# Patient Record
Sex: Female | Born: 1950 | Race: White | Hispanic: No | Marital: Married | Smoking: Former smoker
Health system: Southern US, Community
[De-identification: ages and names within clinical notes are randomized; demographics above are authoritative.]

## PROBLEM LIST (undated history)

## (undated) DIAGNOSIS — F32A Depression, unspecified: Secondary | ICD-10-CM

## (undated) DIAGNOSIS — K760 Fatty (change of) liver, not elsewhere classified: Secondary | ICD-10-CM

## (undated) DIAGNOSIS — M810 Age-related osteoporosis without current pathological fracture: Secondary | ICD-10-CM

## (undated) DIAGNOSIS — H269 Unspecified cataract: Secondary | ICD-10-CM

## (undated) DIAGNOSIS — N189 Chronic kidney disease, unspecified: Secondary | ICD-10-CM

## (undated) DIAGNOSIS — I10 Essential (primary) hypertension: Secondary | ICD-10-CM

## (undated) DIAGNOSIS — Z860100 Personal history of colon polyps, unspecified: Secondary | ICD-10-CM

## (undated) DIAGNOSIS — K219 Gastro-esophageal reflux disease without esophagitis: Secondary | ICD-10-CM

## (undated) DIAGNOSIS — M797 Fibromyalgia: Secondary | ICD-10-CM

## (undated) DIAGNOSIS — K589 Irritable bowel syndrome without diarrhea: Secondary | ICD-10-CM

## (undated) DIAGNOSIS — R911 Solitary pulmonary nodule: Secondary | ICD-10-CM

## (undated) DIAGNOSIS — E079 Disorder of thyroid, unspecified: Secondary | ICD-10-CM

## (undated) DIAGNOSIS — E785 Hyperlipidemia, unspecified: Secondary | ICD-10-CM

## (undated) DIAGNOSIS — G4700 Insomnia, unspecified: Secondary | ICD-10-CM

## (undated) DIAGNOSIS — M779 Enthesopathy, unspecified: Secondary | ICD-10-CM

## (undated) DIAGNOSIS — A6000 Herpesviral infection of urogenital system, unspecified: Secondary | ICD-10-CM

## (undated) DIAGNOSIS — F329 Major depressive disorder, single episode, unspecified: Secondary | ICD-10-CM

## (undated) DIAGNOSIS — F419 Anxiety disorder, unspecified: Secondary | ICD-10-CM

## (undated) DIAGNOSIS — I2510 Atherosclerotic heart disease of native coronary artery without angina pectoris: Secondary | ICD-10-CM

## (undated) DIAGNOSIS — F341 Dysthymic disorder: Secondary | ICD-10-CM

## (undated) DIAGNOSIS — G4730 Sleep apnea, unspecified: Secondary | ICD-10-CM

## (undated) DIAGNOSIS — Z5189 Encounter for other specified aftercare: Secondary | ICD-10-CM

## (undated) DIAGNOSIS — K449 Diaphragmatic hernia without obstruction or gangrene: Secondary | ICD-10-CM

## (undated) HISTORY — DX: Major depressive disorder, single episode, unspecified: F32.9

## (undated) HISTORY — DX: Sleep apnea, unspecified: G47.30

## (undated) HISTORY — DX: Fibromyalgia: M79.7

## (undated) HISTORY — DX: Personal history of colonic polyps: Z86.010

## (undated) HISTORY — PX: CORONARY ANGIOPLASTY: SHX604

## (undated) HISTORY — DX: Irritable bowel syndrome, unspecified: K58.9

## (undated) HISTORY — DX: Age-related osteoporosis without current pathological fracture: M81.0

## (undated) HISTORY — PX: CARDIAC CATHETERIZATION: SHX172

## (undated) HISTORY — DX: Personal history of colon polyps, unspecified: Z86.0100

## (undated) HISTORY — DX: Hyperlipidemia, unspecified: E78.5

## (undated) HISTORY — DX: Herpesviral infection of urogenital system, unspecified: A60.00

## (undated) HISTORY — DX: Dysthymic disorder: F34.1

## (undated) HISTORY — PX: CHOLECYSTECTOMY: SHX55

## (undated) HISTORY — DX: Solitary pulmonary nodule: R91.1

## (undated) HISTORY — DX: Enthesopathy, unspecified: M77.9

## (undated) HISTORY — PX: APPENDECTOMY: SHX54

## (undated) HISTORY — DX: Depression, unspecified: F32.A

## (undated) HISTORY — DX: Insomnia, unspecified: G47.00

## (undated) HISTORY — DX: Gastro-esophageal reflux disease without esophagitis: K21.9

## (undated) HISTORY — DX: Disorder of thyroid, unspecified: E07.9

## (undated) HISTORY — DX: Diaphragmatic hernia without obstruction or gangrene: K44.9

## (undated) HISTORY — DX: Atherosclerotic heart disease of native coronary artery without angina pectoris: I25.10

## (undated) HISTORY — PX: TONSILLECTOMY AND ADENOIDECTOMY: SUR1326

## (undated) HISTORY — DX: Fatty (change of) liver, not elsewhere classified: K76.0

## (undated) HISTORY — DX: Essential (primary) hypertension: I10

## (undated) HISTORY — DX: Anxiety disorder, unspecified: F41.9

## (undated) HISTORY — DX: Encounter for other specified aftercare: Z51.89

## (undated) HISTORY — DX: Unspecified cataract: H26.9

## (undated) HISTORY — DX: Chronic kidney disease, unspecified: N18.9

---

## 2000-06-09 ENCOUNTER — Ambulatory Visit (HOSPITAL_BASED_OUTPATIENT_CLINIC_OR_DEPARTMENT_OTHER): Admission: RE | Admit: 2000-06-09 | Discharge: 2000-06-09 | Payer: Self-pay | Admitting: Otolaryngology

## 2008-11-22 DIAGNOSIS — E785 Hyperlipidemia, unspecified: Secondary | ICD-10-CM | POA: Insufficient documentation

## 2008-11-22 DIAGNOSIS — E7801 Familial hypercholesterolemia: Secondary | ICD-10-CM | POA: Insufficient documentation

## 2008-11-24 DIAGNOSIS — K219 Gastro-esophageal reflux disease without esophagitis: Secondary | ICD-10-CM | POA: Insufficient documentation

## 2009-02-13 IMAGING — US US SOFT TISSUE HEAD/NECK
1 series · 14 of 25 positions shown · non-contrast
Comparison: NONE

CLINICAL DATA: Thyroid nodule. 

THYROID ULTRASOUND

[Series 1: us thyroid · 0.07mm/px · 14 of 30 slices shown]
[im 1/30]
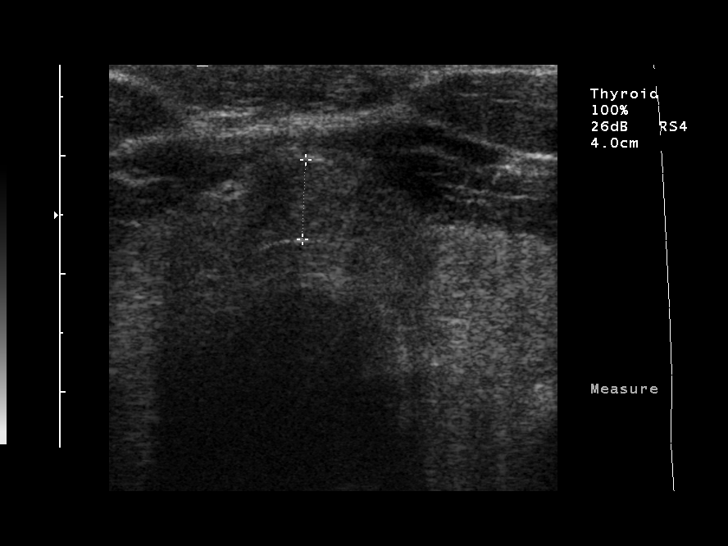
[im 3/30]
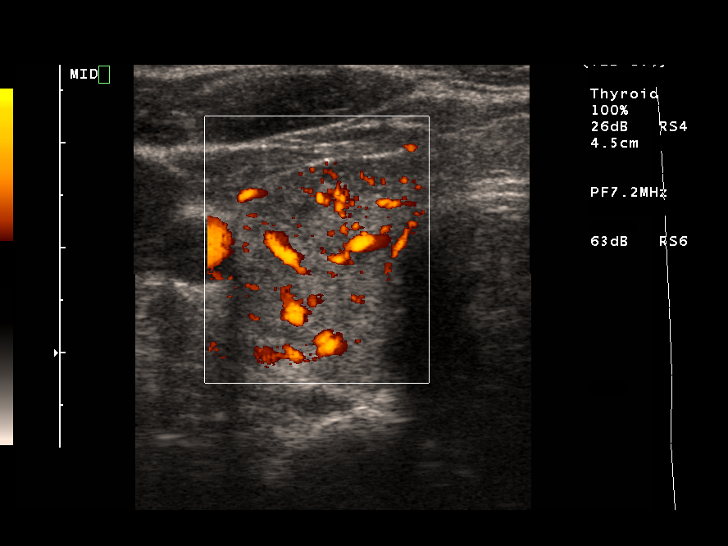
[im 5/30]
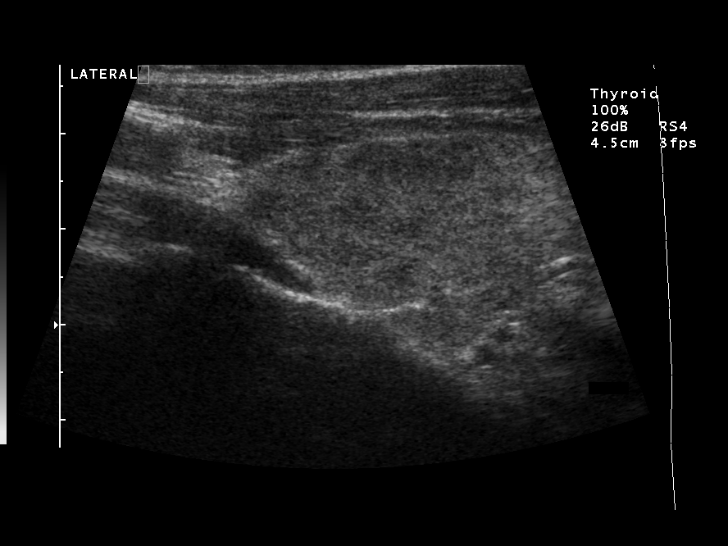
[im 8/30]
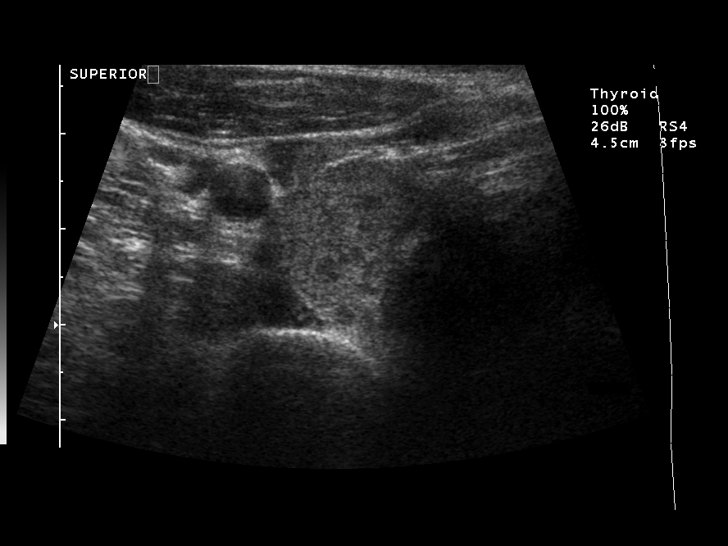
[im 10/30]
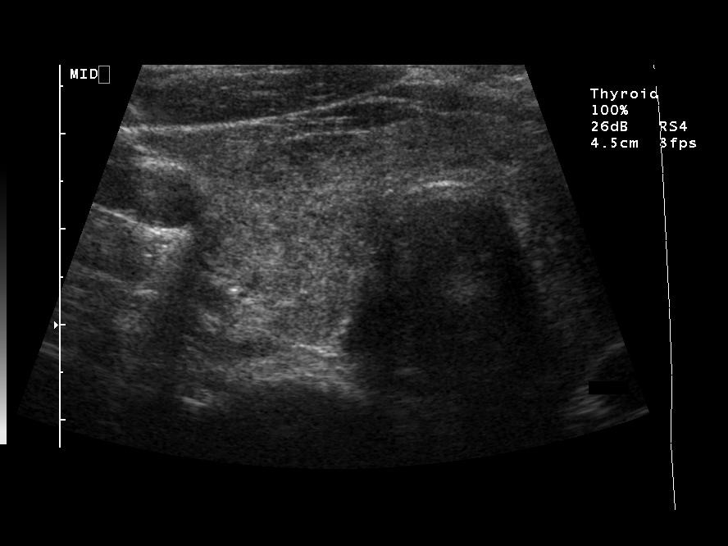
[im 11/30]
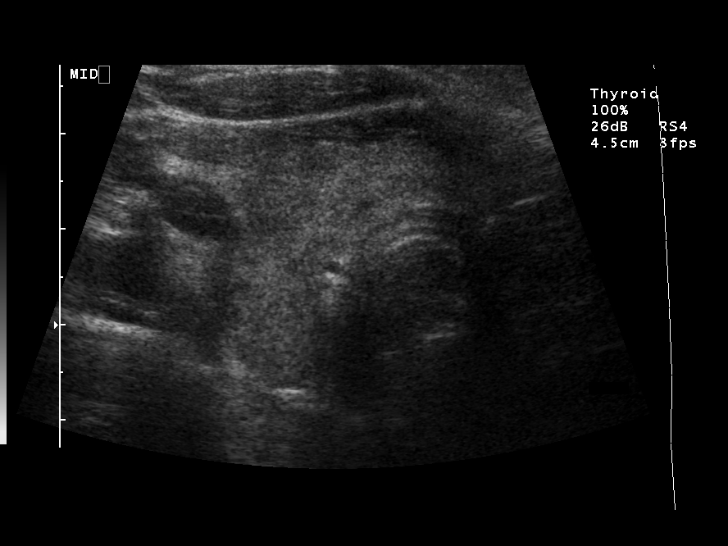
[im 14/30]
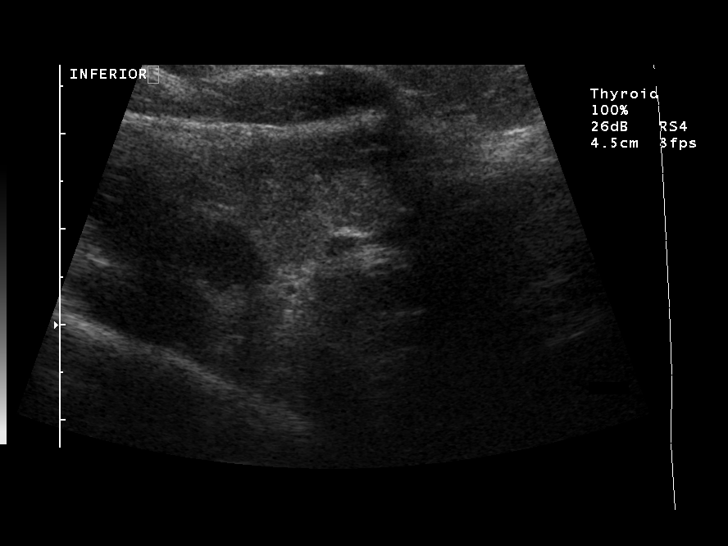
[im 16/30]
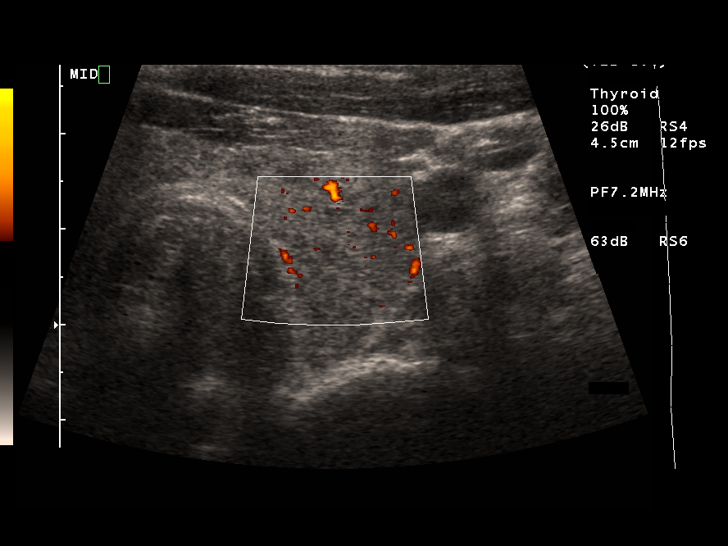
[im 19/30]
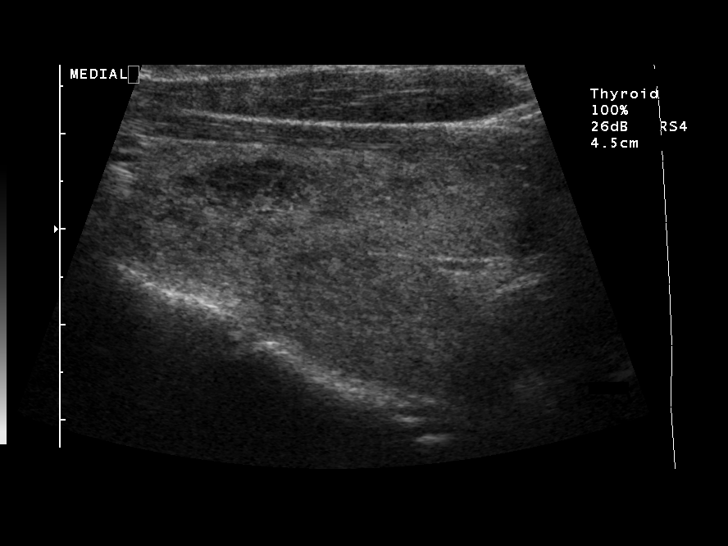
[im 20/30]
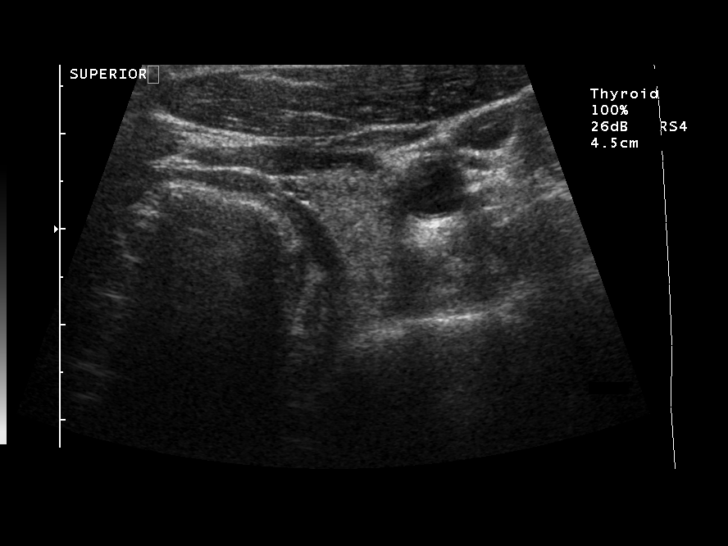
[im 22/30]
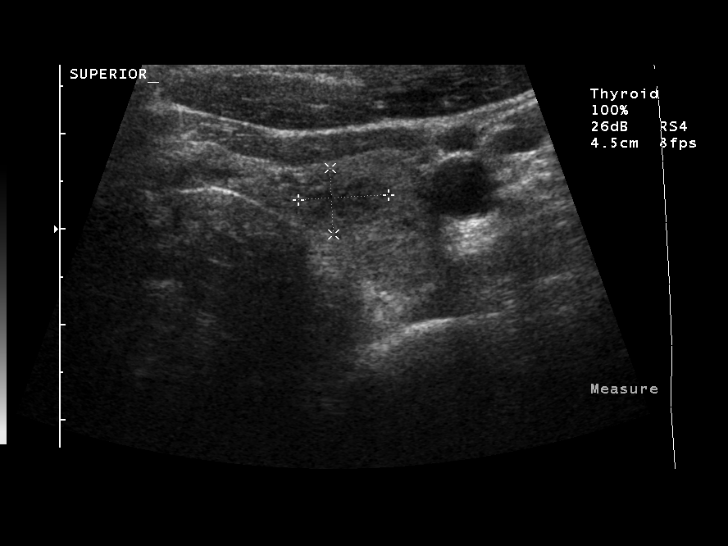
[im 25/30]
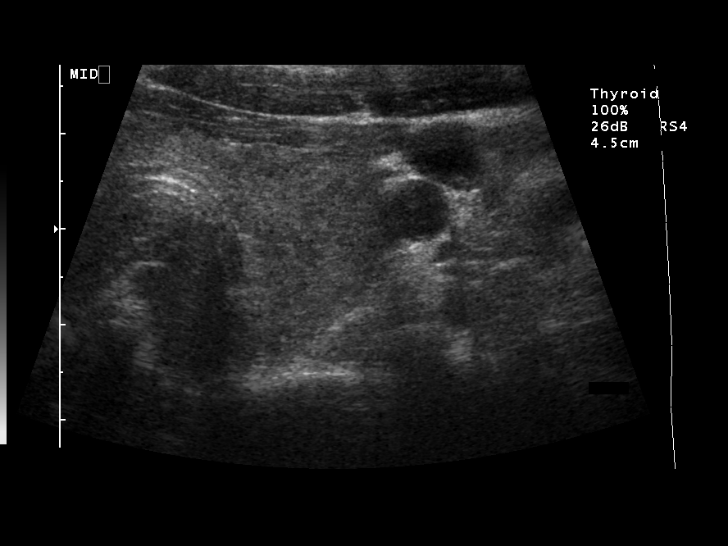
[im 27/30]
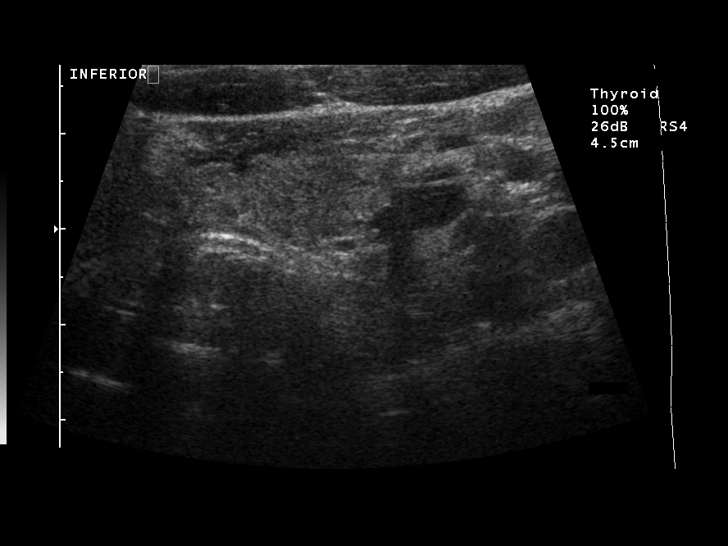
[im 30/30]
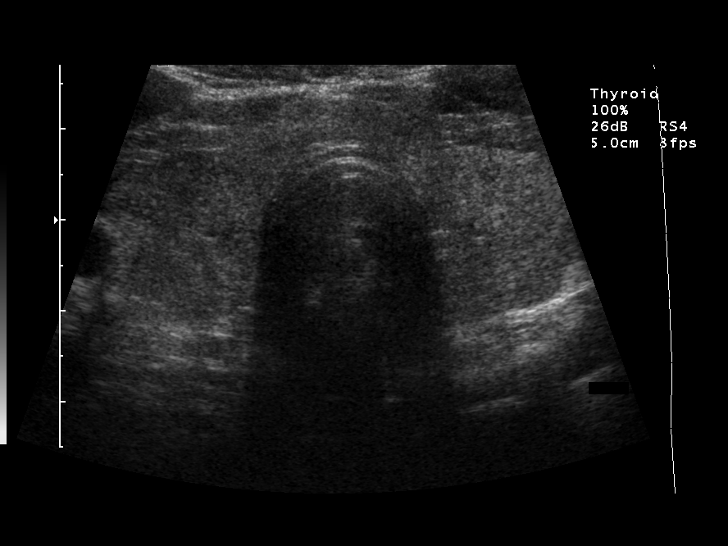

[14 of 25 positions shown; findings below may reference images not displayed]

FINDINGS RIGHT LOBE: 4.6 x 2.2 x 1.6 cm LEFT LOBE: 4.8 x 2.4 x 
1.7 cm ISTHMUS: 0.7 cm The echo pattern is very heterogeneous with 
ill-defined areas of nodular decreased echogenicity noted 
bilaterally. A poorly-defined area of decreased but 
solid-appearing echos is noted in the upper left lobe, measuring 
0.9 x 0.7 x 1.5 cm. I cannot be certain if this represents a 
pseudo nodule or a true nodule. The thyroid gland is mildly 
enlarged.
IMPRESSION: Mild thyromegaly with a heterogeneous echo pattern. 
Possible nodule versus pseudo nodule is noted in the upper left 
lobe, measuring 1.5 cm. I suggest a follow-up study in 3-6 months 
to demonstrate stability. Correlation with nuclear medicine scan 
may also be helpful. Luiyi Foerster, M.D. electronically 
reviewed on 04/05/2007 Dict Date: 04/05/2007  Tran Date: 
04/05/2007 CAV  [REDACTED]

## 2011-07-11 HISTORY — PX: COLONOSCOPY: SHX174

## 2011-07-11 HISTORY — PX: ESOPHAGOGASTRODUODENOSCOPY: SHX1529

## 2011-09-09 DIAGNOSIS — E039 Hypothyroidism, unspecified: Secondary | ICD-10-CM | POA: Insufficient documentation

## 2015-04-24 DIAGNOSIS — I219 Acute myocardial infarction, unspecified: Secondary | ICD-10-CM

## 2015-04-24 HISTORY — DX: Acute myocardial infarction, unspecified: I21.9

## 2015-06-08 DIAGNOSIS — I2510 Atherosclerotic heart disease of native coronary artery without angina pectoris: Secondary | ICD-10-CM | POA: Insufficient documentation

## 2015-06-08 DIAGNOSIS — I10 Essential (primary) hypertension: Secondary | ICD-10-CM | POA: Insufficient documentation

## 2015-06-08 DIAGNOSIS — E785 Hyperlipidemia, unspecified: Secondary | ICD-10-CM | POA: Insufficient documentation

## 2016-08-23 DIAGNOSIS — I10 Essential (primary) hypertension: Secondary | ICD-10-CM | POA: Diagnosis not present

## 2016-08-23 DIAGNOSIS — R079 Chest pain, unspecified: Secondary | ICD-10-CM | POA: Diagnosis not present

## 2016-08-24 DIAGNOSIS — R079 Chest pain, unspecified: Secondary | ICD-10-CM

## 2016-08-24 DIAGNOSIS — I10 Essential (primary) hypertension: Secondary | ICD-10-CM | POA: Diagnosis not present

## 2016-08-24 DIAGNOSIS — I2510 Atherosclerotic heart disease of native coronary artery without angina pectoris: Secondary | ICD-10-CM | POA: Diagnosis not present

## 2016-09-04 ENCOUNTER — Ambulatory Visit: Payer: Self-pay | Admitting: Cardiology

## 2016-09-24 DIAGNOSIS — R079 Chest pain, unspecified: Secondary | ICD-10-CM

## 2016-09-30 DIAGNOSIS — I252 Old myocardial infarction: Secondary | ICD-10-CM | POA: Insufficient documentation

## 2017-04-08 DIAGNOSIS — Z955 Presence of coronary angioplasty implant and graft: Secondary | ICD-10-CM | POA: Insufficient documentation

## 2017-04-16 DIAGNOSIS — N183 Chronic kidney disease, stage 3 unspecified: Secondary | ICD-10-CM | POA: Insufficient documentation

## 2017-04-16 DIAGNOSIS — K589 Irritable bowel syndrome without diarrhea: Secondary | ICD-10-CM | POA: Insufficient documentation

## 2017-09-24 DIAGNOSIS — E063 Autoimmune thyroiditis: Secondary | ICD-10-CM | POA: Insufficient documentation

## 2017-09-24 DIAGNOSIS — M797 Fibromyalgia: Secondary | ICD-10-CM | POA: Insufficient documentation

## 2017-11-23 ENCOUNTER — Ambulatory Visit (INDEPENDENT_AMBULATORY_CARE_PROVIDER_SITE_OTHER): Payer: Medicare Other | Admitting: Podiatry

## 2017-11-23 ENCOUNTER — Ambulatory Visit (INDEPENDENT_AMBULATORY_CARE_PROVIDER_SITE_OTHER): Payer: Medicare Other

## 2017-11-23 ENCOUNTER — Other Ambulatory Visit: Payer: Self-pay

## 2017-11-23 ENCOUNTER — Encounter: Payer: Self-pay | Admitting: Podiatry

## 2017-11-23 VITALS — BP 119/61 | HR 62 | Resp 16 | Ht 66.0 in | Wt 160.0 lb

## 2017-11-23 DIAGNOSIS — M7751 Other enthesopathy of right foot: Secondary | ICD-10-CM | POA: Diagnosis not present

## 2017-11-23 DIAGNOSIS — M79671 Pain in right foot: Secondary | ICD-10-CM

## 2017-11-23 DIAGNOSIS — M7731 Calcaneal spur, right foot: Secondary | ICD-10-CM | POA: Diagnosis not present

## 2017-11-23 DIAGNOSIS — M722 Plantar fascial fibromatosis: Secondary | ICD-10-CM | POA: Diagnosis not present

## 2017-11-23 DIAGNOSIS — M216X1 Other acquired deformities of right foot: Secondary | ICD-10-CM

## 2017-11-23 NOTE — Progress Notes (Signed)
   Subjective:    Patient ID: Suzanne Norris, female    DOB: 06-07-1950, 67 y.o.   MRN: [id number]  HPI    Review of Systems  Musculoskeletal: Positive for arthralgias and myalgias.  All other systems reviewed and are negative.      Objective:   Physical Exam        Assessment & Plan:

## 2017-11-23 NOTE — Patient Instructions (Signed)

## 2017-11-23 NOTE — Progress Notes (Signed)
Subjective:  Patient ID: Suzanne Norris, female    DOB: 12-18-1950,  MRN: [id number]  Chief Complaint  Patient presents with  . Foot Pain    R bottom heel pain x 1 mo; 9/10 stabbing pain -no injuries Pt. stated," it's worst when walking or in the morning." Tx: OTC inserts and tylenol    67 y.o. female presents with the above complaint. Above history confirmed with patient.  Review of Systems: Negative except as noted in the HPI. Denies N/V/F/Ch.  Past Medical History:  Diagnosis Date  . Anxiety and depression   . CAD (coronary artery disease)    post left heart cath  . Hyperlipidemia   . Hypertension   . Thyroid disease     Current Outpatient Medications:  .  aspirin EC 325 MG tablet, Take by mouth., Disp: , Rfl:  .  azithromycin (ZITHROMAX) 250 MG tablet, See admin instructions., Disp: , Rfl: 0 .  buPROPion (WELLBUTRIN) 100 MG tablet, Take 300 mg by mouth daily., Disp: , Rfl:  .  DULoxetine (CYMBALTA) 60 MG capsule, Take 60 mg by mouth daily., Disp: , Rfl: 6 .  levothyroxine (SYNTHROID, LEVOTHROID) 112 MCG tablet, Take 112 mcg by mouth daily., Disp: , Rfl: 5 .  lisinopril (PRINIVIL,ZESTRIL) 2.5 MG tablet, Take 1 tablet by mouth 2 (two) times daily., Disp: , Rfl:  .  metoprolol tartrate (LOPRESSOR) 25 MG tablet, Take 1 tablet by mouth 2 (two) times daily., Disp: , Rfl:  .  Pregnenolone POWD, Take 0.5 mg by mouth daily., Disp: , Rfl:  .  QUEtiapine (SEROQUEL) 25 MG tablet, Take 25 mg by mouth daily., Disp: , Rfl:  .  rosuvastatin (CRESTOR) 20 MG tablet, Take 1 tablet by mouth daily., Disp: , Rfl:  .  thyroid (ARMOUR) 32.5 MG tablet, Take 1.25 tablets by mouth daily. , Disp: , Rfl:   Social History   Tobacco Use  Smoking Status Never Smoker  Smokeless Tobacco Never Used    Allergies  Allergen Reactions  . Atorvastatin Other (See Comments)    Brain fog, muscle pain, and weakness   Objective:   Vitals:   11/23/17 1043  BP: 119/61  Pulse: 62  Resp: 16   Body mass  index is 25.82 kg/m. Constitutional Well developed. Well nourished.  Vascular Dorsalis pedis pulses palpable bilaterally. Posterior tibial pulses palpable bilaterally. Capillary refill normal to all digits.  No cyanosis or clubbing noted. Pedal hair growth normal.  Neurologic Normal speech. Oriented to person, place, and time. Epicritic sensation to light touch grossly present bilaterally.  Dermatologic Nails well groomed and normal in appearance. No open wounds. No skin lesions.  Orthopedic: Normal joint ROM without pain or crepitus bilaterally. No visible deformities. Tender to palpation at the calcaneal tuber right. No pain with calcaneal squeeze right. Ankle ROM diminished range of motion right. Silfverskiold Test: positive right.   Radiographs: Taken and reviewed. No acute fractures or dislocations. No evidence of stress fracture.  Plantar heel spur present. Posterior heel spur absent.   Assessment:   1. Plantar fasciitis   2. Pain of right heel   3. Heel spur, right   4. Bursitis of heel, right   5. Acquired equinus deformity of right foot    Plan:  Patient was evaluated and treated and all questions answered.  Plantar Fasciitis, right - XR reviewed as above.  - Educated on icing and stretching. Instructions given.  - Injection delivered to the plantar fascia as below. - DME: Night splint dispensed. -  Pharmacologic management: Hold off NSAIDs due to CKD Stage III  Procedure: Injection Tendon/Ligament Location: Right plantar fascia at the glabrous junction; medial approach. Skin Prep: alcohol Injectate: 1 cc 0.5% marcaine plain, 1 cc dexamethasone phosphate, 0.5 cc kenalog 10. Disposition: Patient tolerated procedure well. Injection site dressed with a band-aid.  Return in about 3 weeks (around 12/14/2017) for Plantar fasciitis, Right.

## 2017-11-27 ENCOUNTER — Other Ambulatory Visit: Payer: Self-pay | Admitting: Podiatry

## 2017-11-27 DIAGNOSIS — M7751 Other enthesopathy of right foot: Secondary | ICD-10-CM

## 2017-11-27 DIAGNOSIS — M216X1 Other acquired deformities of right foot: Secondary | ICD-10-CM

## 2017-11-27 DIAGNOSIS — M722 Plantar fascial fibromatosis: Secondary | ICD-10-CM

## 2017-11-27 DIAGNOSIS — M79671 Pain in right foot: Secondary | ICD-10-CM

## 2017-11-27 DIAGNOSIS — M7731 Calcaneal spur, right foot: Secondary | ICD-10-CM

## 2017-12-14 ENCOUNTER — Ambulatory Visit (INDEPENDENT_AMBULATORY_CARE_PROVIDER_SITE_OTHER): Payer: Medicare Other | Admitting: Podiatry

## 2017-12-14 DIAGNOSIS — R252 Cramp and spasm: Secondary | ICD-10-CM

## 2017-12-14 DIAGNOSIS — R21 Rash and other nonspecific skin eruption: Secondary | ICD-10-CM | POA: Diagnosis not present

## 2017-12-14 DIAGNOSIS — M79671 Pain in right foot: Secondary | ICD-10-CM

## 2017-12-14 DIAGNOSIS — M722 Plantar fascial fibromatosis: Secondary | ICD-10-CM | POA: Diagnosis not present

## 2017-12-14 NOTE — Progress Notes (Signed)
Subjective:  Patient ID: Suzanne Norris, female    DOB: 25-Nov-1950,  MRN: [id number]  Chief Complaint  Patient presents with  . Plantar Fasciitis    F/U R PF Pt. stated," it's much better, but if Im on my feet a lot it gets sore but not as bas as it used to ." Tx: NS and epsom salt soaking    67 y.o. female presents with the above complaint. Above history confirmed with patient.  New complaints of cramping in the feet and rash to the left foot. States the rash is itchy, has been using oregano oil on it.  Review of Systems: Negative except as noted in the HPI. Denies N/V/F/Ch.  Past Medical History:  Diagnosis Date  . Anxiety and depression   . CAD (coronary artery disease)    post left heart cath  . Hyperlipidemia   . Hypertension   . Thyroid disease     Current Outpatient Medications:  .  aspirin EC 325 MG tablet, Take by mouth., Disp: , Rfl:  .  azithromycin (ZITHROMAX) 250 MG tablet, See admin instructions., Disp: , Rfl: 0 .  buPROPion (WELLBUTRIN) 100 MG tablet, Take 300 mg by mouth daily., Disp: , Rfl:  .  DULoxetine (CYMBALTA) 60 MG capsule, Take 60 mg by mouth daily., Disp: , Rfl: 6 .  levothyroxine (SYNTHROID, LEVOTHROID) 112 MCG tablet, Take 112 mcg by mouth daily., Disp: , Rfl: 5 .  lisinopril (PRINIVIL,ZESTRIL) 2.5 MG tablet, Take 1 tablet by mouth 2 (two) times daily., Disp: , Rfl:  .  metoprolol tartrate (LOPRESSOR) 25 MG tablet, Take 1 tablet by mouth 2 (two) times daily., Disp: , Rfl:  .  Pregnenolone POWD, Take 0.5 mg by mouth daily., Disp: , Rfl:  .  QUEtiapine (SEROQUEL) 25 MG tablet, Take 25 mg by mouth daily., Disp: , Rfl:  .  rosuvastatin (CRESTOR) 20 MG tablet, Take 1 tablet by mouth daily., Disp: , Rfl:  .  thyroid (ARMOUR) 32.5 MG tablet, Take 1.25 tablets by mouth daily. , Disp: , Rfl:   Social History   Tobacco Use  Smoking Status Never Smoker  Smokeless Tobacco Never Used    Allergies  Allergen Reactions  . Atorvastatin Other (See Comments)      Brain fog, muscle pain, and weakness   Objective:   There were no vitals filed for this visit. There is no height or weight on file to calculate BMI. Constitutional Well developed. Well nourished.  Vascular Dorsalis pedis pulses palpable bilaterally. Posterior tibial pulses palpable bilaterally. Capillary refill normal to all digits.  No cyanosis or clubbing noted. Pedal hair growth normal.  Neurologic Normal speech. Oriented to person, place, and time. Epicritic sensation to light touch grossly present bilaterally.  Dermatologic Nails well groomed and normal in appearance. No open wounds. No skin lesions. Papular rash left plantar arch.  Orthopedic: Normal joint ROM without pain or crepitus bilaterally. No visible deformities. Tender to palpation at the calcaneal tuber right. No pain with calcaneal squeeze right. Ankle ROM diminished range of motion right. Silfverskiold Test: positive right.   Radiographs:  10/14: No acute fractures or dislocations. No evidence of stress fracture.  Plantar heel spur present. Posterior heel spur absent.   Assessment:   1. Plantar fasciitis   2. Pain of right heel   3. Cramp in limb   4. Rash    Plan:  Patient was evaluated and treated and all questions answered.  Plantar Fasciitis, right - Injection #2 delivered to the plantar  fascia as below.  Cramping in Feet - Recommend tonic water / quinine for cramping  Eczema L Foot - Recc Hydrocortisone cream  Return in about 3 weeks (around 01/04/2018) for Plantar fasciitis, Right; rash left.

## 2018-01-04 ENCOUNTER — Ambulatory Visit (INDEPENDENT_AMBULATORY_CARE_PROVIDER_SITE_OTHER): Payer: Medicare Other | Admitting: Podiatry

## 2018-01-04 DIAGNOSIS — R252 Cramp and spasm: Secondary | ICD-10-CM

## 2018-01-04 DIAGNOSIS — M79671 Pain in right foot: Secondary | ICD-10-CM

## 2018-01-04 DIAGNOSIS — M722 Plantar fascial fibromatosis: Secondary | ICD-10-CM

## 2018-01-04 DIAGNOSIS — B351 Tinea unguium: Secondary | ICD-10-CM | POA: Diagnosis not present

## 2018-01-04 DIAGNOSIS — R21 Rash and other nonspecific skin eruption: Secondary | ICD-10-CM

## 2018-01-04 MED ORDER — CICLOPIROX 8 % EX SOLN: Freq: Every day | CUTANEOUS | 0 refills | Status: DC

## 2018-01-04 NOTE — Progress Notes (Signed)
Subjective:  Patient ID: Suzanne Norris, female    DOB: May 25, 1950,  MRN: [id number]  No chief complaint on file.   67 y.o. female presents with the above complaint. States that the heels are doing much better. Stopped wearing the boot to do some irritation is experiencing from the outside of it after slipping around.  Trying to reduce and thinks that is helping with the cramping.  Uses the anti-inflammatory cream for the rash on her arch and states is doing much better.  New complaint of fungal toenails has tried several over-the-counter but he is interested in other options.  Review of Systems: Negative except as noted in the HPI. Denies N/V/F/Ch.  Past Medical History:  Diagnosis Date  . Anxiety and depression   . CAD (coronary artery disease)    post left heart cath  . Hyperlipidemia   . Hypertension   . Thyroid disease     Current Outpatient Medications:  .  aspirin EC 325 MG tablet, Take by mouth., Disp: , Rfl:  .  azithromycin (ZITHROMAX) 250 MG tablet, See admin instructions., Disp: , Rfl: 0 .  buPROPion (WELLBUTRIN) 100 MG tablet, Take 300 mg by mouth daily., Disp: , Rfl:  .  ciclopirox (PENLAC) 8 % solution, Apply topically at bedtime. Apply over nail and surrounding skin. Apply daily over previous coat. Remove weekly with file or polish remover., Disp: 6.6 mL, Rfl: 0 .  DULoxetine (CYMBALTA) 60 MG capsule, Take 60 mg by mouth daily., Disp: , Rfl: 6 .  levothyroxine (SYNTHROID, LEVOTHROID) 112 MCG tablet, Take 112 mcg by mouth daily., Disp: , Rfl: 5 .  lisinopril (PRINIVIL,ZESTRIL) 2.5 MG tablet, Take 1 tablet by mouth 2 (two) times daily., Disp: , Rfl:  .  metoprolol tartrate (LOPRESSOR) 25 MG tablet, Take 1 tablet by mouth 2 (two) times daily., Disp: , Rfl:  .  Pregnenolone POWD, Take 0.5 mg by mouth daily., Disp: , Rfl:  .  QUEtiapine (SEROQUEL) 25 MG tablet, Take 25 mg by mouth daily., Disp: , Rfl:  .  rosuvastatin (CRESTOR) 20 MG tablet, Take 1 tablet by mouth daily.,  Disp: , Rfl:  .  thyroid (ARMOUR) 32.5 MG tablet, Take 1.25 tablets by mouth daily. , Disp: , Rfl:   Social History   Tobacco Use  Smoking Status Never Smoker  Smokeless Tobacco Never Used    Allergies  Allergen Reactions  . Atorvastatin Other (See Comments)    Brain fog, muscle pain, and weakness   Objective:   There were no vitals filed for this visit. There is no height or weight on file to calculate BMI. Constitutional Well developed. Well nourished.  Vascular Dorsalis pedis pulses palpable bilaterally. Posterior tibial pulses palpable bilaterally. Capillary refill normal to all digits.  No cyanosis or clubbing noted. Pedal hair growth normal.  Neurologic Normal speech. Oriented to person, place, and time. Epicritic sensation to light touch grossly present bilaterally.  Dermatologic Nails thickend, dystrophic, yellow in appearance. No open wounds. No skin lesions. Resolving rash plantar arch left.  Orthopedic: Normal joint ROM without pain or crepitus bilaterally. No visible deformities. No tenderness medial calc bilat No pain with calcaneal squeeze right. Ankle ROM diminished range of motion right. Silfverskiold Test: positive right.   Radiographs:  10/14: No acute fractures or dislocations. No evidence of stress fracture.  Plantar heel spur present. Posterior heel spur absent.   Assessment:   1. Onychomycosis   2. Plantar fasciitis   3. Pain of right heel   4. Cramp  in limb   5. Rash    Plan:  Patient was evaluated and treated and all questions answered.  Plantar Fasciitis, right - Defer injection today, pain improved. - Continue stretching and icing  Cramping in Feet - Improving somewhat  Eczema L Foot - Resolved.  Onychomycosis -Rx Penlac. Educated on use -Avoid oral meds due to CKD.  No follow-ups on file.

## 2018-03-09 ENCOUNTER — Ambulatory Visit (INDEPENDENT_AMBULATORY_CARE_PROVIDER_SITE_OTHER): Payer: Medicare Other | Admitting: Podiatry

## 2018-03-09 ENCOUNTER — Other Ambulatory Visit: Payer: Self-pay

## 2018-03-09 DIAGNOSIS — M722 Plantar fascial fibromatosis: Secondary | ICD-10-CM | POA: Diagnosis not present

## 2018-03-09 DIAGNOSIS — B351 Tinea unguium: Secondary | ICD-10-CM

## 2018-03-09 DIAGNOSIS — M79671 Pain in right foot: Secondary | ICD-10-CM | POA: Diagnosis not present

## 2018-03-09 MED ORDER — CICLOPIROX 8 % EX SOLN: Freq: Every day | CUTANEOUS | 0 refills | Status: DC

## 2018-03-09 NOTE — Progress Notes (Signed)
Subjective:  Patient ID: Suzanne Norris, female    DOB: Jun 16, 1950,  MRN: [id number]  Chief Complaint  Patient presents with  . Plantar Fasciitis    F/U R PF Pt. states," pretty good, but if I'm on it a lot it hurts the next day; 3/10 intermittnet pain." Tx: stretching, icing and fascial brace  . Nail Problem    F/U nail fungus PT. states," somewhat better." Tx: penlac    68 y.o. female presents with the above complaint.    Review of Systems: Negative except as noted in the HPI. Denies N/V/F/Ch.  Past Medical History:  Diagnosis Date  . Anxiety and depression   . CAD (coronary artery disease)    post left heart cath  . Hyperlipidemia   . Hypertension   . Thyroid disease     Current Outpatient Medications:  .  aspirin EC 325 MG tablet, Take by mouth., Disp: , Rfl:  .  azithromycin (ZITHROMAX) 250 MG tablet, See admin instructions., Disp: , Rfl: 0 .  buPROPion (WELLBUTRIN XL) 150 MG 24 hr tablet, , Disp: , Rfl:  .  buPROPion (WELLBUTRIN) 100 MG tablet, Take 300 mg by mouth daily., Disp: , Rfl:  .  ciclopirox (PENLAC) 8 % solution, Apply topically at bedtime. Apply over nail and surrounding skin. Apply daily over previous coat. Remove weekly with file or polish remover., Disp: 6.6 mL, Rfl: 0 .  DULoxetine (CYMBALTA) 60 MG capsule, Take 60 mg by mouth daily., Disp: , Rfl: 6 .  levothyroxine (SYNTHROID, LEVOTHROID) 112 MCG tablet, Take 112 mcg by mouth daily., Disp: , Rfl: 5 .  lisinopril (PRINIVIL,ZESTRIL) 2.5 MG tablet, Take 1 tablet by mouth 2 (two) times daily., Disp: , Rfl:  .  metoprolol tartrate (LOPRESSOR) 25 MG tablet, Take 1 tablet by mouth 2 (two) times daily., Disp: , Rfl:  .  Pregnenolone POWD, Take 0.5 mg by mouth daily., Disp: , Rfl:  .  QUEtiapine (SEROQUEL) 25 MG tablet, Take 25 mg by mouth daily., Disp: , Rfl:  .  rosuvastatin (CRESTOR) 20 MG tablet, Take 1 tablet by mouth daily., Disp: , Rfl:  .  thyroid (ARMOUR) 32.5 MG tablet, Take 1.25 tablets by mouth daily. ,  Disp: , Rfl:   Social History   Tobacco Use  Smoking Status Never Smoker  Smokeless Tobacco Never Used    Allergies  Allergen Reactions  . Atorvastatin Other (See Comments)    Brain fog, muscle pain, and weakness   Objective:   There were no vitals filed for this visit. There is no height or weight on file to calculate BMI. Constitutional Well developed. Well nourished.  Vascular Dorsalis pedis pulses palpable bilaterally. Posterior tibial pulses palpable bilaterally. Capillary refill normal to all digits.  No cyanosis or clubbing noted. Pedal hair growth normal.  Neurologic Normal speech. Oriented to person, place, and time. Epicritic sensation to light touch grossly present bilaterally.  Dermatologic Nails thickend, dystrophic, yellow in appearance. Clear growth noted. No open wounds. No skin lesions. Resolving rash plantar arch left.  Orthopedic: Normal joint ROM without pain or crepitus bilaterally. No visible deformities. No tenderness medial calc bilat No pain with calcaneal squeeze right. Ankle ROM diminished range of motion right. Silfverskiold Test: positive right.   Radiographs:  10/14: No acute fractures or dislocations. No evidence of stress fracture.  Plantar heel spur present. Posterior heel spur absent.   Assessment:   1. Plantar fasciitis   2. Pain of right heel   3. Onychomycosis  Plan:  Patient was evaluated and treated and all questions answered.  Plantar Fasciitis, right - Continue stretching and icing - Continue PF brace  Onychomycosis -Refill Penlac  Return if symptoms worsen or fail to improve.

## 2019-06-22 DIAGNOSIS — Z955 Presence of coronary angioplasty implant and graft: Secondary | ICD-10-CM

## 2019-06-22 HISTORY — PX: CORONARY ANGIOPLASTY WITH STENT PLACEMENT: SHX49

## 2019-06-22 HISTORY — DX: Presence of coronary angioplasty implant and graft: Z95.5

## 2019-06-23 ENCOUNTER — Encounter: Payer: Self-pay | Admitting: Gastroenterology

## 2019-06-24 ENCOUNTER — Ambulatory Visit: Payer: BLUE CROSS/BLUE SHIELD | Admitting: Gastroenterology

## 2019-08-03 ENCOUNTER — Ambulatory Visit (INDEPENDENT_AMBULATORY_CARE_PROVIDER_SITE_OTHER): Payer: Medicare Other | Admitting: Gastroenterology

## 2019-08-03 ENCOUNTER — Encounter: Payer: Self-pay | Admitting: Gastroenterology

## 2019-08-03 VITALS — BP 114/64 | HR 50 | Temp 97.3°F | Ht 65.0 in | Wt 169.2 lb

## 2019-08-03 DIAGNOSIS — I25119 Atherosclerotic heart disease of native coronary artery with unspecified angina pectoris: Secondary | ICD-10-CM

## 2019-08-03 DIAGNOSIS — K219 Gastro-esophageal reflux disease without esophagitis: Secondary | ICD-10-CM | POA: Diagnosis not present

## 2019-08-03 DIAGNOSIS — K760 Fatty (change of) liver, not elsewhere classified: Secondary | ICD-10-CM | POA: Diagnosis not present

## 2019-08-03 DIAGNOSIS — K449 Diaphragmatic hernia without obstruction or gangrene: Secondary | ICD-10-CM

## 2019-08-03 DIAGNOSIS — K581 Irritable bowel syndrome with constipation: Secondary | ICD-10-CM

## 2019-08-03 MED ORDER — PANTOPRAZOLE SODIUM 40 MG PO TBEC
40.0000 mg | DELAYED_RELEASE_TABLET | Freq: Every day | ORAL | 11 refills | Status: DC
Start: 2019-08-03 — End: 2020-07-25

## 2019-08-03 NOTE — Progress Notes (Signed)
Chief Complaint: GERD/constipation/abnormal CT chest.  Referring Provider:  Imagene Riches, NP      ASSESSMENT AND PLAN;   #1. GERD with small HH. H/O short segment Barrett's  #2. Fatty liver with Nl LFTs  #3. CAD s/p recent RCA stenting May 2021, currently on aspirin/Plavix.  #4. IBS-C   Plan: -Restart Protonix 40mg  po qd #30, 11 refills -Benefiber 1 TBS po QD with 8 ounces of water or can put it in coffee. -Walking (she is scheduled to go through cardiac rehab) -Lose 6lb over 12 weeks.  Avoid fatty foods, sodas and high calorie corn syrup. -CMP, CBC @ Dr Sudie Grumbling office.  Patient is scheduled to have blood work done there already. -EGD/colon with 2-day prep when OK with Dr Otho Perl (likely May 2022-that will be 1 year after recent RCA stenting when she can be taken off Plavix) -FU in 6 months.  Earlier, if still with problems.   HPI:    Suzanne Norris is a 69 y.o. female  With CAD s/p RCA stent 5/12/2021on asa/plavix, NSTEMI s/p LAD stent 04/2015, HTN, HLD, fatty liver on CT chest, CKD3, hypothyroidism, anxiety/depression/fibromyalgia.  Here for further evaluation of reflux and fatty liver on CT.  She would occasionally have heartburn without odynophagia or dysphagia.  Has been diagnosed as having small hiatal hernia previously on EGD and most recently on CT.  She was also having nonspecific postprandial symptoms.  Underwent cardiac evaluation which showed significant RCA stenosis and is s/p RCA stenting on Jun 22, 2019.  She is currently on aspirin/Plavix.  Her upper GI symptoms have gotten better except for heartburn.  She does have previously diagnosed IBS-C with pellet-like stools, lower abdominal discomfort with bloating which gets better with defecation.  Her constipation is better with fiber intake.  She underwent CTA chest which showed fatty infiltration of the liver.  Her liver function tests were unremarkable.  It also showed small hiatal hernia.  She does admit  that she has gained weight.  9 pounds since 2019.  As detailed below Wt Readings from Last 3 Encounters:  08/03/19 169 lb 4 oz (76.8 kg)  11/23/17 160 lb (72.6 kg)   Past GI procedures: -EGD 07/11/2011: Short segment Barrett's esophagus (Bx-consistent with Barrett's without dysplasia), small hiatal hernia.  Got letters to repeat EGD in 3 years. -Colonoscopy 06/2011 fair preparation, highly redundant colon.  Negative except for internal hemorrhoids.  Recommended to repeat in 5 years.  Letters were sent but no response.  CTA chest 06/21/2019: -No acute abnormality. -Pulmonary nodules.  Being followed by Dr. Otho Perl -Small hiatal hernia -Fatty liver without cirrhosis. -S/p cholecystectomy.    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 Anxiety and depression   . CAD (coronary artery disease)    post left heart cath  . Depression   . Dysthymia   . Fibromyalgia   . Hiatal hernia with GERD   . History of colonic polyps   . Hyperlipidemia   . Hypertension   . IBS (irritable bowel syndrome)   . Kidney disease    stage 3  . Thyroid disease     Past Surgical History:  Procedure Laterality Date  . APPENDECTOMY    . CARDIAC CATHETERIZATION    . CHOLECYSTECTOMY    . COLONOSCOPY  07/11/2011   Small internal hemorrhoids. Highly redundant colon. Otherwise grossly normal colonoscopy to the terminal ileum   . CORONARY ANGIOPLASTY    . CORONARY ANGIOPLASTY WITH STENT PLACEMENT  06/22/2019   [facility]  . ESOPHAGOGASTRODUODENOSCOPY  07/11/2011   Small hiatal hernia. Irregular Z-line suggestive of gastroesophageal reflux with a tongue-like salmon-colored mucosa projecting above the gastroesophageal junction (biposied to rule out short segment Barrett's esophagus).   . TONSILLECTOMY AND ADENOIDECTOMY      Family History  Problem Relation Age of Onset  . Heart attack Father   . Alcoholism Father   . Heart disease Father   . Colon cancer Maternal Grandmother   .  Stomach cancer Maternal Grandmother   . Esophageal cancer Neg Hx     Social History   Tobacco Use  . Smoking status: Former Research scientist (life sciences)  . Smokeless tobacco: Never Used  Vaping Use  . Vaping Use: Never used  Substance Use Topics  . Alcohol use: Not Currently  . Drug use: No    Current Outpatient Medications  Medication Sig Dispense Refill  . amLODipine (NORVASC) 2.5 MG tablet Take 2.5 mg by mouth daily.    Marland Kitchen aspirin EC 81 MG tablet Take 81 mg by mouth daily. Swallow whole.    Marland Kitchen buPROPion (WELLBUTRIN XL) 300 MG 24 hr tablet Take 300 mg by mouth daily.    . clopidogrel (PLAVIX) 75 MG tablet Take 75 mg by mouth daily.    . DULoxetine (CYMBALTA) 60 MG capsule Take 60 mg by mouth daily.  6  . ezetimibe (ZETIA) 10 MG tablet Take 10 mg by mouth daily.    Marland Kitchen levothyroxine (SYNTHROID) 88 MCG tablet Take 88 mcg by mouth daily before breakfast.    . lisinopril (PRINIVIL,ZESTRIL) 2.5 MG tablet Take 1 tablet by mouth 2 (two) times daily.    Marland Kitchen LORazepam (ATIVAN) 0.5 MG tablet Take 0.5 mg by mouth as needed for anxiety.    . methylphenidate (RITALIN) 5 MG tablet Take 5 mg by mouth 2 (two) times daily.    . metoprolol tartrate (LOPRESSOR) 50 MG tablet Take 50 mg by mouth 2 (two) times daily.    . nitroGLYCERIN (NITROSTAT) 0.4 MG SL tablet Place 0.4 mg under the tongue every 5 (five) minutes as needed for chest pain.    . rosuvastatin (CRESTOR) 40 MG tablet Take 40 mg by mouth daily.    Marland Kitchen zolpidem (AMBIEN CR) 12.5 MG CR tablet Take 12.5 mg by mouth at bedtime as needed for sleep.     No current facility-administered medications for this visit.    Allergies  Allergen Reactions  . Atorvastatin Other (See Comments)    Brain fog, muscle pain, and weakness  . Egg [Eggs Or Egg-Derived Products]     Sensitivity. Paraoral demitits    Review of Systems:  Constitutional: Denies fever, chills, diaphoresis, appetite change and has fatigue.  HEENT: Denies photophobia, eye pain, redness, hearing loss, ear  pain, congestion, sore throat, rhinorrhea, sneezing, mouth sores, neck pain, neck stiffness and tinnitus.   Respiratory: Denies SOB, DOE, cough, chest tightness,  and wheezing.   Cardiovascular: Denies chest pain, palpitations and leg swelling.  Genitourinary: Denies dysuria, urgency, frequency, hematuria, flank pain and difficulty urinating.  Musculoskeletal: has myalgias, back pain, No joint swelling, arthralgias and gait problem.  Skin: No rash.  Neurological: Denies dizziness, seizures, syncope, weakness, light-headedness, numbness and headaches.  Hematological: Denies adenopathy. Easy bruising, personal or family bleeding history  Psychiatric/Behavioral: Has anxiety or depression     Physical Exam:    BP 114/64   Pulse (!) 50   Temp (!) 97.3 F (36.3 C)   Ht 5\' 5"  (1.651 m)   Wt  169 lb 4 oz (76.8 kg)   BMI 28.16 kg/m  Wt Readings from Last 3 Encounters:  08/03/19 169 lb 4 oz (76.8 kg)  11/23/17 160 lb (72.6 kg)   Constitutional:  Well-developed, in no acute distress. Psychiatric: Normal mood and affect. Behavior is normal. HEENT: Pupils normal.  Conjunctivae are normal. No scleral icterus. Neck supple.  Cardiovascular: Normal rate, regular rhythm. No edema Pulmonary/chest: Effort normal and breath sounds normal. No wheezing, rales or rhonchi. Abdominal: Soft, nondistended. Nontender. Bowel sounds active throughout. There are no masses palpable. No hepatomegaly. Rectal:  defered Neurological: Alert and oriented to person place and time. Skin: Skin is warm and dry. No rashes noted.  Data Reviewed: I have personally reviewed following labs and imaging studies     Carmell Austria, MD 08/03/2019, 11:18 AM  Cc: Imagene Riches, NP

## 2019-08-03 NOTE — Patient Instructions (Addendum)
If you are age 67 or older, your body mass index should be between 23-30. Your Body mass index is 28.16 kg/m. If this is out of the aforementioned range listed, please consider follow up with your Primary Care Provider.  If you are age 67 or younger, your body mass index should be between 19-25. Your Body mass index is 28.16 kg/m. If this is out of the aformentioned range listed, please consider follow up with your Primary Care Provider.   We have sent the following medications to your pharmacy for you to pick up at your convenience: Protonix 40 mg daily  Benefiber 1 tablespoon daily. (this is over-the-counter)  Walking (cardiac rehab)  Lose 6 pounds over 12 weeks.  EGD/Colon when cleared with Dr. Otho Perl.  Thank you for choosing me and [facility].   Jackquline Denmark, MD

## 2020-07-25 ENCOUNTER — Other Ambulatory Visit: Payer: Self-pay | Admitting: Gastroenterology

## 2020-09-18 ENCOUNTER — Ambulatory Visit: Payer: PRIVATE HEALTH INSURANCE | Admitting: Gastroenterology

## 2020-11-01 ENCOUNTER — Other Ambulatory Visit: Payer: Self-pay | Admitting: Gastroenterology

## 2020-11-09 ENCOUNTER — Encounter: Payer: Self-pay | Admitting: Gastroenterology

## 2020-11-09 ENCOUNTER — Other Ambulatory Visit: Payer: Self-pay

## 2020-11-09 ENCOUNTER — Ambulatory Visit (INDEPENDENT_AMBULATORY_CARE_PROVIDER_SITE_OTHER): Payer: Medicare Other | Admitting: Gastroenterology

## 2020-11-09 VITALS — BP 130/62 | HR 99 | Ht 65.0 in | Wt 143.0 lb

## 2020-11-09 DIAGNOSIS — K219 Gastro-esophageal reflux disease without esophagitis: Secondary | ICD-10-CM

## 2020-11-09 DIAGNOSIS — Z1211 Encounter for screening for malignant neoplasm of colon: Secondary | ICD-10-CM

## 2020-11-09 DIAGNOSIS — K581 Irritable bowel syndrome with constipation: Secondary | ICD-10-CM | POA: Diagnosis not present

## 2020-11-09 DIAGNOSIS — K449 Diaphragmatic hernia without obstruction or gangrene: Secondary | ICD-10-CM

## 2020-11-09 DIAGNOSIS — K760 Fatty (change of) liver, not elsewhere classified: Secondary | ICD-10-CM

## 2020-11-09 NOTE — Progress Notes (Signed)
Chief Complaint: FU  Referring Provider:  Imagene Riches, NP      ASSESSMENT AND PLAN;   #1. GERD with small HH. H/O short segment Barrett's  #2. Fatty liver with Nl LFTs  #3. CAD s/p recent RCA stenting May 2021  #4. IBS-C  #5. Colorectal cancer screening   Plan: -Continue Protonix. Car try it every other day -Benefiber 1 TBS po QD with 8 ounces of water or can put it in coffee. -EGD/colon with Miralax prep   I discussed EGD/Colonoscopy- the indications, risks, alternatives and potential complications including, but not limited to, bleeding, infection, reaction to medication, damage to internal organs, cardiac and/or pulmonary problems, and perforation requiring surgery. The possibility that significant findings could be missed was explained. All ? were answered. The patient gives consent to proceed.  HPI:    Suzanne KREISER is a 70 y.o. female  With CAD s/p RCA stent 06/22/2019, NSTEMI s/p LAD stent 04/2015 (Nl EF), HTN, HLD, fatty liver on CT chest, CKD3, hypothyroidism, anxiety/depression/fibromyalgia.  For FU visit.  Doing much better from GI standpoint.  Reflux under good control with Protonix 40 mg p.o. once a day.  Constipation is much better with Benefiber.  She has also stopped her amlodipine d/t dizziness.  Has reduced weight from 160lb to 143lb as below.  Here for further evaluation of reflux and fatty liver on CT. Wt Readings from Last 3 Encounters:  11/09/20 143 lb (64.9 kg)  08/03/19 169 lb 4 oz (76.8 kg)  11/23/17 160 lb (72.6 kg)   She is off plavix and continued on aspirin.   Past GI procedures: -EGD 07/11/2011: Short segment Barrett's esophagus (Bx-consistent with Barrett's without dysplasia), small hiatal hernia.  Got letters to repeat EGD in 3 years. -Colonoscopy 06/2011 fair preparation, highly redundant colon.  Negative except for internal hemorrhoids.  Recommended to repeat in 5 years.  Letters were sent but no response.  CTA chest  06/21/2019: -No acute abnormality. -Pulmonary nodules.  Being followed by Dr. Otho Perl -Small hiatal hernia -Fatty liver without cirrhosis. -S/p cholecystectomy.    SH: -Retd Education officer, [employer] Past Medical History:  Diagnosis Date   Anxiety and depression    have GAD and recurrent major depression (Dr Pasty Arch, MD [place])   CAD (coronary artery disease)    post left heart cath.      Dr Abran Richard, MD [facility]   Chronic kidney disease    stage 3 Dr. Belinda Block MD, [facility]    Dysthymia    Fatty liver    Fibromyalgia    Genital herpes    H/O heart artery stent 06/22/2019   Ostial RCA   Heart attack ([place]) 04/24/2015   NSTEMI, LAD   Hiatal hernia with GERD    History of colonic polyps    Hyperlipidemia    Hypertension    IBS (irritable bowel syndrome)    Insomnia    Lesion of right lung    (granulomatous), multiple lesions, glass lung opacity   Tendonitis    Thyroid disease    Hypothyroidism, Hasimoto's throiditis Dr Francetta Found, [facility]    Past Surgical History:  Procedure Laterality Date   APPENDECTOMY     CARDIAC CATHETERIZATION     CHOLECYSTECTOMY     COLONOSCOPY  07/11/2011   Small internal hemorrhoids. Highly redundant colon. Otherwise grossly normal colonoscopy to the terminal ileum    CORONARY ANGIOPLASTY     CORONARY ANGIOPLASTY WITH STENT PLACEMENT  06/22/2019  [place]   ESOPHAGOGASTRODUODENOSCOPY  07/11/2011   Small hiatal hernia. Irregular Z-line suggestive of gastroesophageal reflux with a tongue-like salmon-colored mucosa projecting above the gastroesophageal junction (biposied to rule out short segment Barrett's esophagus).    TONSILLECTOMY AND ADENOIDECTOMY      Family History  Problem Relation Age of Onset   Heart attack Father    Alcoholism Father    Heart disease Father    Colon cancer Maternal Grandmother    Stomach cancer Maternal Grandmother    Esophageal cancer Neg Hx     Social History   Tobacco Use   Smoking  status: Former   Smokeless tobacco: Never  Scientific laboratory technician Use: Never used  Substance Use Topics   Alcohol use: Not Currently   Drug use: No    Current Outpatient Medications  Medication Sig Dispense Refill   aspirin EC 81 MG tablet Take 81 mg by mouth daily. Swallow whole.     buPROPion (WELLBUTRIN XL) 300 MG 24 hr tablet Take 300 mg by mouth daily.     DULoxetine (CYMBALTA) 60 MG capsule Take 60 mg by mouth daily.  6   levothyroxine (SYNTHROID) 88 MCG tablet Take 88 mcg by mouth daily before breakfast.     lisinopril (PRINIVIL,ZESTRIL) 2.5 MG tablet Take 1 tablet by mouth 2 (two) times daily.     LORazepam (ATIVAN) 0.5 MG tablet Take 0.5 mg by mouth as needed for anxiety.     metoprolol tartrate (LOPRESSOR) 50 MG tablet Take 50 mg by mouth 2 (two) times daily.     nitroGLYCERIN (NITROSTAT) 0.4 MG SL tablet Place 0.4 mg under the tongue every 5 (five) minutes as needed for chest pain.     pantoprazole (PROTONIX) 40 MG tablet TAKE 1 TABLET BY MOUTH ONCE DAILY . APPOINTMENT REQUIRED FOR FUTURE REFILLS 90 tablet 0   REPATHA SURECLICK 371 MG/ML SOAJ Inject into the skin.     rosuvastatin (CRESTOR) 40 MG tablet Take 40 mg by mouth daily.     zolpidem (AMBIEN CR) 12.5 MG CR tablet Take 12.5 mg by mouth at bedtime as needed for sleep.     No current facility-administered medications for this visit.    Allergies  Allergen Reactions   Atorvastatin Other (See Comments)    Brain fog, muscle pain, and weakness   Egg [Eggs Or Egg-Derived Products]     Sensitivity. Perioral Dermatitis    Review of Systems:  neg     Physical Exam:    BP 130/62   Pulse 99   Ht 5\' 5"  (1.651 m)   Wt 143 lb (64.9 kg)   SpO2 (!) 47%   BMI 23.80 kg/m  Wt Readings from Last 3 Encounters:  11/09/20 143 lb (64.9 kg)  08/03/19 169 lb 4 oz (76.8 kg)  11/23/17 160 lb (72.6 kg)   Constitutional:  Well-developed, in no acute distress. Psychiatric: Normal mood and affect. Behavior is normal. HEENT:  Pupils normal.  Conjunctivae are normal. No scleral icterus. Cardiovascular: Normal rate, regular rhythm. No edema Pulmonary/chest: Effort normal and breath sounds normal. No wheezing, rales or rhonchi. Abdominal: Soft, nondistended. Nontender. Bowel sounds active throughout. There are no masses palpable. No hepatomegaly. Rectal:  defered Neurological: Alert and oriented to person place and time. Skin: Skin is warm and dry. No rashes noted.    Carmell Austria, MD 11/09/2020, 9:56 AM  Cc: Imagene Riches, NP

## 2020-11-09 NOTE — Patient Instructions (Signed)
If you are age 68 or older, your body mass index should be between 23-30. Your Body mass index is 23.8 kg/m. If this is out of the aforementioned range listed, please consider follow up with your Primary Care Provider.  If you are age 44 or younger, your body mass index should be between 19-25. Your Body mass index is 23.8 kg/m. If this is out of the aformentioned range listed, please consider follow up with your Primary Care Provider.   __________________________________________________________  The [place] [facility] providers would like to encourage you to use [facility] to communicate with providers for non-urgent requests or questions.  Due to long hold times on the telephone, sending your provider a message by [facility] may be a faster and more efficient way to get a response.  Please allow 48 business hours for a response.  Please remember that this is for non-urgent requests.   You have been scheduled for an endoscopy and colonoscopy. Please follow the written instructions given to you at your visit today. Please pick up your prep supplies at the pharmacy within the next 1-3 days. If you use inhalers (even only as needed), please bring them with you on the day of your procedure.  Please call with any questions or concerns.  Thank you,  Dr. Jackquline Denmark

## 2020-11-23 ENCOUNTER — Ambulatory Visit (AMBULATORY_SURGERY_CENTER): Payer: Medicare Other | Admitting: Gastroenterology

## 2020-11-23 ENCOUNTER — Encounter: Payer: Self-pay | Admitting: Gastroenterology

## 2020-11-23 VITALS — BP 132/70 | HR 58 | Temp 97.8°F | Resp 26 | Ht 65.0 in | Wt 143.0 lb

## 2020-11-23 DIAGNOSIS — R1310 Dysphagia, unspecified: Secondary | ICD-10-CM

## 2020-11-23 DIAGNOSIS — D120 Benign neoplasm of cecum: Secondary | ICD-10-CM | POA: Diagnosis not present

## 2020-11-23 DIAGNOSIS — K2270 Barrett's esophagus without dysplasia: Secondary | ICD-10-CM | POA: Diagnosis not present

## 2020-11-23 DIAGNOSIS — Z86010 Personal history of colonic polyps: Secondary | ICD-10-CM

## 2020-11-23 DIAGNOSIS — K219 Gastro-esophageal reflux disease without esophagitis: Secondary | ICD-10-CM

## 2020-11-23 DIAGNOSIS — K760 Fatty (change of) liver, not elsewhere classified: Secondary | ICD-10-CM

## 2020-11-23 DIAGNOSIS — K581 Irritable bowel syndrome with constipation: Secondary | ICD-10-CM

## 2020-11-23 DIAGNOSIS — D123 Benign neoplasm of transverse colon: Secondary | ICD-10-CM

## 2020-11-23 DIAGNOSIS — K3189 Other diseases of stomach and duodenum: Secondary | ICD-10-CM | POA: Diagnosis not present

## 2020-11-23 MED ORDER — SODIUM CHLORIDE 0.9 % IV SOLN
500.0000 mL | Freq: Once | INTRAVENOUS | Status: DC
Start: 2020-11-23 — End: 2020-11-23

## 2020-11-23 NOTE — Progress Notes (Signed)
Called to room to assist during endoscopic procedure.  Patient ID and intended procedure confirmed with present staff. Received instructions for my participation in the procedure from the performing physician.  

## 2020-11-23 NOTE — Patient Instructions (Signed)
Await pathology  Follow dilation diet today- see handout  Continue your normal medications  Please read over handouts about polyps, diverticulosis, hemorrhoids, hiatal hernia and esophageal stricture   YOU HAD AN ENDOSCOPIC PROCEDURE TODAY AT [facility]:   Refer to the procedure report that was given to you for any specific questions about what was found during the examination.  If the procedure report does not answer your questions, please call your gastroenterologist to clarify.  If you requested that your care partner not be given the details of your procedure findings, then the procedure report has been included in a sealed envelope for you to review at your convenience later.  YOU SHOULD EXPECT: Some feelings of bloating in the abdomen. Passage of more gas than usual.  Walking can help get rid of the air that was put into your GI tract during the procedure and reduce the bloating. If you had a lower endoscopy (such as a colonoscopy or flexible sigmoidoscopy) you may notice spotting of blood in your stool or on the toilet paper. If you underwent a bowel prep for your procedure, you may not have a normal bowel movement for a few days.  Please Note:  You might notice some irritation and congestion in your nose or some drainage.  This is from the oxygen used during your procedure.  There is no need for concern and it should clear up in a day or so.  SYMPTOMS TO REPORT IMMEDIATELY:  Following lower endoscopy (colonoscopy or flexible sigmoidoscopy):  Excessive amounts of blood in the stool  Significant tenderness or worsening of abdominal pains  Swelling of the abdomen that is new, acute  Fever of 100F or higher  Following upper endoscopy (EGD)  Vomiting of blood or coffee ground material  New chest pain or pain under the shoulder blades  Painful or persistently difficult swallowing  New shortness of breath  Fever of 100F or higher  Black, tarry-looking stools  For  urgent or emergent issues, a gastroenterologist can be reached at any hour by calling [phone]. Do not use MyChart messaging for urgent concerns.    DIET:  We do recommend a small meal at first, but then you may proceed to your regular diet.  Drink plenty of fluids but you should avoid alcoholic beverages for 24 hours.  ACTIVITY:  You should plan to take it easy for the rest of today and you should NOT DRIVE or use heavy machinery until tomorrow (because of the sedation medicines used during the test).    FOLLOW UP: Our staff will call the number listed on your records 48-72 hours following your procedure to check on you and address any questions or concerns that you may have regarding the information given to you following your procedure. If we do not reach you, we will leave a message.  We will attempt to reach you two times.  During this call, we will ask if you have developed any symptoms of COVID 19. If you develop any symptoms (ie: fever, flu-like symptoms, shortness of breath, cough etc.) before then, please call [phone].  If you test positive for Covid 19 in the 2 weeks post procedure, please call and report this information to [place].    If any biopsies were taken you will be contacted by phone or by letter within the next 1-3 weeks.  Please call us at [phone] if you have not heard about the biopsies in 3 weeks.    SIGNATURES/CONFIDENTIALITY: You and/or  your care partner have signed paperwork which will be entered into your electronic medical record.  These signatures attest to the fact that that the information above on your After Visit Summary has been reviewed and is understood.  Full responsibility of the confidentiality of this discharge information lies with you and/or your care-partner.

## 2020-11-23 NOTE — Op Note (Signed)
[facility] Patient Name: Suzanne Norris Procedure Date: 11/23/2020 2:24 PM MRN: [id number] Endoscopist: Jackquline Denmark , MD Age: 70 Referring MD:  Date of Birth: Mar 29, 1950 Gender: Female Account #: [id number] Procedure:                Upper GI endoscopy Indications:              Follow-up of Barrett's esophagus. Occ dysphagia. Medicines:                Monitored Anesthesia Care Procedure:                Pre-Anesthesia Assessment:                           - Prior to the procedure, a History and Physical                            was performed, and patient medications and                            allergies were reviewed. The patient's tolerance of                            previous anesthesia was also reviewed. The risks                            and benefits of the procedure and the sedation                            options and risks were discussed with the patient.                            All questions were answered, and informed consent                            was obtained. Prior Anticoagulants: The patient has                            taken no previous anticoagulant or antiplatelet                            agents. ASA Grade Assessment: II - A patient with                            mild systemic disease. After reviewing the risks                            and benefits, the patient was deemed in                            satisfactory condition to undergo the procedure.                           After obtaining informed consent, the endoscope was  passed under direct vision. Throughout the                            procedure, the patient's blood pressure, pulse, and                            oxygen saturations were monitored continuously. The                            Endoscope was introduced through the mouth, and                            advanced to the second part of duodenum. The upper                            GI  endoscopy was accomplished without difficulty.                            The patient tolerated the procedure well. Scope In: Scope Out: Findings:                 Tongue-like and scattered islands of salmon-colored                            mucosa were present from 36 to 38 cm. Biopsies were                            taken with a cold forceps for histology, directed                            by NBI. No strictures. The scope was withdrawn.                            Dilation was performed with a Maloney dilator with                            mild resistance at 50 Fr.                           A 2 cm hiatal hernia was present. The gastric                            mucosa otherwise was normal. Biopsies were taken                            with a cold forceps for histology.                           The examined duodenum was normal. Biopsies for                            histology were taken with a cold forceps for  evaluation of celiac disease. Complications:            No immediate complications. Estimated Blood Loss:     Estimated blood loss: none. Impression:               - Short-segment Barrett's esophagus. Biopsied.                            Dilated.                           - 2 cm hiatal hernia. Biopsied.                           - Normal examined duodenum. Biopsied. Recommendation:           - Patient has a contact number available for                            emergencies. The signs and symptoms of potential                            delayed complications were discussed with the                            patient. Return to normal activities tomorrow.                            Written discharge instructions were provided to the                            patient.                           - Post dilatation diet.                           - Continue present medications.                           - Await pathology results.                            - The findings and recommendations were discussed                            with the patient's family. Jackquline Denmark, MD 11/23/2020 3:14:25 PM This report has been signed electronically.

## 2020-11-23 NOTE — Progress Notes (Signed)
Chief Complaint: FU  Referring Provider:  Imagene Riches, NP      ASSESSMENT AND PLAN;   #1. GERD with small HH. H/O short segment Barrett's  #2. Fatty liver with Nl LFTs  #3. CAD s/p recent RCA stenting May 2021  #4. IBS-C  #5. Colorectal cancer screening   Plan: -Continue Protonix. Car try it every other day -Benefiber 1 TBS po QD with 8 ounces of water or can put it in coffee. -EGD/colon with Miralax prep   I discussed EGD/Colonoscopy- the indications, risks, alternatives and potential complications including, but not limited to, bleeding, infection, reaction to medication, damage to internal organs, cardiac and/or pulmonary problems, and perforation requiring surgery. The possibility that significant findings could be missed was explained. All ? were answered. The patient gives consent to proceed.  HPI:    Suzanne Norris is a 70 y.o. female  With CAD s/p RCA stent 06/22/2019, NSTEMI s/p LAD stent 04/2015 (Nl EF), HTN, HLD, fatty liver on CT chest, CKD3, hypothyroidism, anxiety/depression/fibromyalgia.  For FU visit.  Doing much better from GI standpoint.  Reflux under good control with Protonix 40 mg p.o. once a day.  Constipation is much better with Benefiber.  She has also stopped her amlodipine d/t dizziness.  Has reduced weight from 160lb to 143lb as below.  Here for further evaluation of reflux and fatty liver on CT. Wt Readings from Last 3 Encounters:  11/23/20 143 lb (64.9 kg)  11/09/20 143 lb (64.9 kg)  08/03/19 169 lb 4 oz (76.8 kg)   She is off plavix and continued on aspirin.   Past GI procedures: -EGD 07/11/2011: Short segment Barrett's esophagus (Bx-consistent with Barrett's without dysplasia), small hiatal hernia.  Got letters to repeat EGD in 3 years. -Colonoscopy 06/2011 fair preparation, highly redundant colon.  Negative except for internal hemorrhoids.  Recommended to repeat in 5 years.  Letters were sent but no response.  CTA chest  06/21/2019: -No acute abnormality. -Pulmonary nodules.  Being followed by Dr. Otho Perl -Small hiatal hernia -Fatty liver without cirrhosis. -S/p cholecystectomy.    SH: -Retd Education officer, [employer] Past Medical History:  Diagnosis Date   Anxiety and depression    have GAD and recurrent major depression (Dr Pasty Arch, MD [place])   Blood transfusion without reported diagnosis    CAD (coronary artery disease)    post left heart cath.      Dr Abran Richard, MD [facility]   Cataract    Chronic kidney disease    stage 3 Dr. Belinda Block MD, [place]    Dysthymia    Fatty liver    Fibromyalgia    Genital herpes    H/O heart artery stent 06/22/2019   Ostial RCA   Heart attack ([place]) 04/24/2015   NSTEMI, LAD   Hiatal hernia with GERD    History of colonic polyps    Hyperlipidemia    Hypertension    IBS (irritable bowel syndrome)    Insomnia    Lesion of right lung    (granulomatous), multiple lesions, glass lung opacity   Osteoporosis    Sleep apnea    Tendonitis    Thyroid disease    Hypothyroidism, Hasimoto's throiditis Dr Francetta Found, [facility]    Past Surgical History:  Procedure Laterality Date   APPENDECTOMY     CARDIAC CATHETERIZATION     CHOLECYSTECTOMY     COLONOSCOPY  07/11/2011   Small internal hemorrhoids. Highly redundant colon. Otherwise grossly normal colonoscopy to  the terminal ileum    CORONARY ANGIOPLASTY     CORONARY ANGIOPLASTY WITH STENT PLACEMENT  06/22/2019   [facility]   ESOPHAGOGASTRODUODENOSCOPY  07/11/2011   Small hiatal hernia. Irregular Z-line suggestive of gastroesophageal reflux with a tongue-like salmon-colored mucosa projecting above the gastroesophageal junction (biposied to rule out short segment Barrett's esophagus).    TONSILLECTOMY AND ADENOIDECTOMY      Family History  Problem Relation Age of Onset   Heart attack Father    Alcoholism Father    Heart disease Father    Colon cancer Maternal Grandmother    Stomach cancer  Maternal Grandmother    Esophageal cancer Neg Hx     Social History   Tobacco Use   Smoking status: Former   Smokeless tobacco: Never  Scientific laboratory technician Use: Never used  Substance Use Topics   Alcohol use: Not Currently   Drug use: No    Current Outpatient Medications  Medication Sig Dispense Refill   aspirin EC 81 MG tablet Take 81 mg by mouth daily. Swallow whole.     buPROPion (WELLBUTRIN XL) 300 MG 24 hr tablet Take 300 mg by mouth daily.     DULoxetine (CYMBALTA) 60 MG capsule Take 60 mg by mouth daily.  6   levothyroxine (SYNTHROID) 88 MCG tablet Take 88 mcg by mouth daily before breakfast.     lisinopril (PRINIVIL,ZESTRIL) 2.5 MG tablet Take 1 tablet by mouth 2 (two) times daily.     LORazepam (ATIVAN) 0.5 MG tablet Take 0.5 mg by mouth as needed for anxiety.     metoprolol tartrate (LOPRESSOR) 50 MG tablet Take 50 mg by mouth 2 (two) times daily.     pantoprazole (PROTONIX) 40 MG tablet TAKE 1 TABLET BY MOUTH ONCE DAILY . APPOINTMENT REQUIRED FOR FUTURE REFILLS 90 tablet 0   rosuvastatin (CRESTOR) 40 MG tablet Take 40 mg by mouth daily.     zolpidem (AMBIEN CR) 12.5 MG CR tablet Take 12.5 mg by mouth at bedtime as needed for sleep.     nitroGLYCERIN (NITROSTAT) 0.4 MG SL tablet Place 0.4 mg under the tongue every 5 (five) minutes as needed for chest pain.     REPATHA SURECLICK 431 MG/ML SOAJ Inject into the skin.     Current Facility-Administered Medications  Medication Dose Route Frequency Provider Last Rate Last Admin   0.9 %  sodium chloride infusion  500 mL Intravenous Once Jackquline Denmark, MD        Allergies  Allergen Reactions   Atorvastatin Other (See Comments)    Brain fog, muscle pain, and weakness   Egg [Eggs Or Egg-Derived Products]     Sensitivity. Perioral Dermatitis    Review of Systems:  neg     Physical Exam:    BP (!) 158/54   Pulse (!) 46   Temp 97.8 F (36.6 C)   Ht 5\' 5"  (1.651 m)   Wt 143 lb (64.9 kg)   SpO2 99%   BMI 23.80 kg/m   Wt Readings from Last 3 Encounters:  11/23/20 143 lb (64.9 kg)  11/09/20 143 lb (64.9 kg)  08/03/19 169 lb 4 oz (76.8 kg)   Constitutional:  Well-developed, in no acute distress. Psychiatric: Normal mood and affect. Behavior is normal. HEENT: Pupils normal.  Conjunctivae are normal. No scleral icterus. Cardiovascular: Normal rate, regular rhythm. No edema Pulmonary/chest: Effort normal and breath sounds normal. No wheezing, rales or rhonchi. Abdominal: Soft, nondistended. Nontender. Bowel sounds active throughout. There are no  masses palpable. No hepatomegaly. Rectal:  defered Neurological: Alert and oriented to person place and time. Skin: Skin is warm and dry. No rashes noted.    Carmell Austria, MD 11/23/2020, 2:26 PM  Cc: Imagene Riches, NP

## 2020-11-23 NOTE — Op Note (Signed)
[place] Patient Name: Suzanne Norris Procedure Date: 11/23/2020 2:16 PM MRN: [id number] Endoscopist: Jackquline Denmark , MD Age: 70 Referring MD:  Date of Birth: [birth year] Gender: Female Account #: [id number] Procedure:                Colonoscopy Indications:              High risk colon cancer surveillance: Personal                            history of colonic polyps Medicines:                Monitored Anesthesia Care Procedure:                Pre-Anesthesia Assessment:                           - Prior to the procedure, a History and Physical                            was performed, and patient medications and                            allergies were reviewed. The patient's tolerance of                            previous anesthesia was also reviewed. The risks                            and benefits of the procedure and the sedation                            options and risks were discussed with the patient.                            All questions were answered, and informed consent                            was obtained. Prior Anticoagulants: The patient has                            taken no previous anticoagulant or antiplatelet                            agents. ASA Grade Assessment: II - A patient with                            mild systemic disease. After reviewing the risks                            and benefits, the patient was deemed in                            satisfactory condition to undergo the procedure.  After obtaining informed consent, the colonoscope                            was passed under direct vision. Throughout the                            procedure, the patient's blood pressure, pulse, and                            oxygen saturations were monitored continuously. The                            PCF-HQ190L Colonoscope was introduced through the                            anus and advanced to the the cecum,  identified by                            appendiceal orifice and ileocecal valve. The                            colonoscopy was performed without difficulty. The                            patient tolerated the procedure well. The quality                            of the bowel preparation was good. Scope In: 2:50:49 PM Scope Out: 3:09:01 PM Scope Withdrawal Time: 0 hours 14 minutes 17 seconds  Total Procedure Duration: 0 hours 18 minutes 12 seconds  Findings:                 Two sessile polyps were found in the proximal                            transverse colon and cecum. The polyps were 4 to 6                            mm in size. These polyps were removed with a cold                            snare. Resection and retrieval were complete.                           A few (1-2) rare small-mouthed diverticula were                            found in the sigmoid colon.                           Non-bleeding internal hemorrhoids were found during                            retroflexion. The hemorrhoids were small and  Grade                            I (internal hemorrhoids that do not prolapse).                           The exam was otherwise without abnormality on                            direct and retroflexion views. The colon was highly                            redundant. Complications:            No immediate complications. Estimated Blood Loss:     Estimated blood loss: none. Impression:               - Two 4 to 6 mm polyps in the proximal transverse                            colon and in the cecum, removed with a cold snare.                            Resected and retrieved.                           - Minimal sigmoid diverticulosis.                           - Non-bleeding internal hemorrhoids.                           - The examination was otherwise normal on direct                            and retroflexion views. Recommendation:           - Patient has a contact  number available for                            emergencies. The signs and symptoms of potential                            delayed complications were discussed with the                            patient. Return to normal activities tomorrow.                            Written discharge instructions were provided to the                            patient.                           - Resume previous diet.                           -  Continue present medications.                           - Await pathology results.                           - Repeat colonoscopy for surveillance based on                            pathology results.                           - Return to GI clinic PRN. Jackquline Denmark, MD 11/23/2020 3:17:24 PM This report has been signed electronically.

## 2020-11-23 NOTE — Progress Notes (Signed)
Pt in recovery with monitors in place, VSS. Report given to receiving RN. Bite guard was placed with pt awake to ensure comfort. No dental or soft tissue damage noted. 

## 2020-11-27 ENCOUNTER — Telehealth: Payer: Self-pay | Admitting: *Deleted

## 2020-11-27 NOTE — Telephone Encounter (Signed)
  Follow up Call-  Call back number 11/23/2020  Post procedure Call Back phone  # [phone]  Permission to leave phone message Yes  Some recent data might be hidden     Patient questions:  Do you have a fever, pain , or abdominal swelling? No. Pain Score  0 *  Have you tolerated food without any problems? Yes.    Have you been able to return to your normal activities? Yes.    Do you have any questions about your discharge instructions: Diet   No. Medications  No. Follow up visit  No.  Do you have questions or concerns about your Care? No.  Actions: * If pain score is 4 or above: No action needed, pain <4.  Have you developed a fever since your procedure? no  2.   Have you had an respiratory symptoms (SOB or cough) since your procedure? no  3.   Have you tested positive for COVID 19 since your procedure no  4.   Have you had any family members/close contacts diagnosed with the COVID 19 since your procedure?  no   If yes to any of these questions please route to Joylene John, RN and Joella Prince, RN

## 2020-12-04 ENCOUNTER — Encounter: Payer: Self-pay | Admitting: Gastroenterology

## 2023-09-15 ENCOUNTER — Other Ambulatory Visit: Payer: Self-pay | Admitting: Medical Genetics

## 2023-10-05 ENCOUNTER — Other Ambulatory Visit (HOSPITAL_COMMUNITY)
Admission: RE | Admit: 2023-10-05 | Discharge: 2023-10-05 | Disposition: A | Discharge status: Home / Self Care | Payer: Self-pay | Source: Ambulatory Visit | Attending: Medical Genetics | Admitting: Medical Genetics

## 2023-10-28 ENCOUNTER — Telehealth: Payer: Self-pay | Admitting: Medical Genetics

## 2023-10-28 DIAGNOSIS — E7801 Familial hypercholesterolemia: Secondary | ICD-10-CM

## 2023-10-28 LAB — GENECONNECT MOLECULAR SCREEN: Genetic Analysis Overall Interpretation: POSITIVE — AB

## 2023-10-28 NOTE — Telephone Encounter (Signed)
 [place] GeneConnect Positive Result Note 10/28/2023 3:58 PM  FIRST ATTEMPT: Confirmed I was speaking with Suzanne Norris [phone] by using name and DOB. Informed participant the reason for this call is to provide results for the above study. Results revealed Hereditary High Cholesterol or Familial Hypercholesterolemia. Genetic counseling was offered and participant requests a call back to schedule. All questions were answered, and participant was thanked for their time and support of the above study. Participant was encouraged to contact [facility] if they have any further questions or concerns.

## 2023-10-30 ENCOUNTER — Telehealth: Payer: Self-pay | Admitting: Medical Genetics

## 2023-10-30 NOTE — Telephone Encounter (Signed)
 [place] GeneConnect 10/30/2023  Confirmed I was speaking with Suzanne Norris [phone] by using name and DOB. Genetic counseling was offered and participant is scheduled. All questions were answered, and participant was thanked for their time and support of the above study. Participant was encouraged to contact [facility] if they have any further questions or concerns.    Jordyn Pennstrom, BS [place]  [facility] Clinical Research Specialist II Direct Dial: [phone]  Fax: [phone]

## 2023-11-05 ENCOUNTER — Ambulatory Visit (INDEPENDENT_AMBULATORY_CARE_PROVIDER_SITE_OTHER): Admitting: Genetic Counselor

## 2023-11-05 DIAGNOSIS — E7801 Familial hypercholesterolemia: Secondary | ICD-10-CM

## 2023-11-05 DIAGNOSIS — E78019 Familial hypercholesterolemia, unspecified: Secondary | ICD-10-CM

## 2023-11-11 ENCOUNTER — Telehealth: Payer: Self-pay

## 2023-11-11 NOTE — Telephone Encounter (Signed)
 Copied from CRM [id number]. Topic: General - Other >> Nov 11, 2023 11:10 AM Miquel SAILOR wrote: Reason for CRM: PT is looking for Annabella Rase excepting New PT. I let her know there is only 3 NP and she is not one of them. No further questions

## 2023-11-11 NOTE — Telephone Encounter (Signed)
 Dr.Tiffany Cloria has not worked here in years. Message routed back

## 2023-11-12 ENCOUNTER — Encounter: Payer: Self-pay | Admitting: Genetic Counselor

## 2023-11-12 NOTE — Progress Notes (Signed)
 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 Jakel, a 73 y.o. female, was seen for a [place] genetic counseling as follow up to her participation in the [place] Study, part of the [facility] Research Program. Ms. 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   RELEVANT MEDICAL HISTORY:  Ms. Eifert was first diagnosed with high cholesterol in her mid 39s. She was treated with lipitor for over 30 years, stopped due to side effects, specifically muscle weakness and brain fog. She tried rosuvastatin and has been treated by [facility] for several years. She is managed by a lipidologist, Dr. Raylene, through [facility] [place]. Her most recent cholesterol level showed an LDL-C of 178 and total cholesterol of 260.   She has been treated for coronary artery disease, had stents placed, and a heart attack around 2017. She has a history of exertional angina.   She does not have a personal history of cancer.   Past Medical History:  Diagnosis Date   Anxiety and depression    have GAD and recurrent major depression (Dr Lynwood Barters, MD [facility])   Blood transfusion without reported diagnosis    CAD (coronary artery disease)    post left heart cath.      Dr Debby Raylene, MD [facility]   Cataract    Chronic kidney disease    stage 3 Dr. Cathryne Nat Molt MD, [place]    Dysthymia    Fatty liver    Fibromyalgia    Genital herpes    H/O heart artery stent 06/22/2019   Ostial RCA   Heart attack (HCC) 04/24/2015   NSTEMI, LAD   Hiatal hernia with GERD    History of colonic polyps    Hyperlipidemia    Hypertension    IBS (irritable bowel syndrome)    Insomnia    Lesion of right lung     (granulomatous), multiple lesions, glass lung opacity   Osteoporosis    Sleep apnea    Tendonitis    Thyroid disease    Hypothyroidism, Hasimoto's throiditis Dr Donnice Lipps, [facility]    Past Surgical History:  Procedure Laterality Date   APPENDECTOMY     CARDIAC CATHETERIZATION     CHOLECYSTECTOMY     COLONOSCOPY  07/11/2011   Small internal hemorrhoids. Highly redundant colon. Otherwise grossly normal colonoscopy to the terminal ileum    CORONARY ANGIOPLASTY     CORONARY ANGIOPLASTY WITH STENT PLACEMENT  06/22/2019   [facility]   ESOPHAGOGASTRODUODENOSCOPY  07/11/2011   Small hiatal hernia. Irregular Z-line suggestive of gastroesophageal reflux with a tongue-like salmon-colored mucosa projecting above the gastroesophageal junction (biposied to rule out short segment Barrett's esophagus).    TONSILLECTOMY AND ADENOIDECTOMY      Social History   Socioeconomic History   Marital status: Married    Spouse name: Not on file   Number of children: Not on file   Years of education: Not on file   Highest education level: Not on file  Occupational History   Not on file  Tobacco Use   Smoking status: Former   Smokeless tobacco: Never  Vaping Use   Vaping status: Never Used  Substance and Sexual Activity   Alcohol use:  Not Currently   Drug use: No   Sexual activity: Not on file  Other Topics Concern   Not on file  Social History Narrative   Not on file   Social Drivers of Health   Financial Resource Strain: Low Risk  (08/22/2021)   Received from [facility] to [facility]12[facility]   Overall Financial Resource Strain (CARDIA)    Difficulty of Paying Living Expenses: Not hard at all  Food Insecurity: Low Risk  (10/19/2023)   [facility]   Hunger Vital Sign    Within the past 12 months, you worried that your food would run out before you got money to buy more: Never true    Within the past 12 months, the food you bought  just didn't last and you didn't have money to get more. : Never true  Transportation Needs: No Transportation Needs (10/19/2023)   Received from [facility]    In the past 12 months, has lack of reliable transportation kept you from medical appointments, meetings, work or from getting things needed for daily living? : No  Physical Activity: Inactive (08/22/2021)   [facility] visits prior to [facility]12/2022[facility]   Exercise Vital Sign    On average, how many days per week do you engage in moderate to strenuous exercise (like a brisk walk)?: 0 days    On average, how many minutes do you engage in exercise at this level?: 0 min  Stress: Stress Concern Present (08/22/2021)   [facility] prior to [facility]12/2022[facility]   [employer] of Occupational Health - Occupational Stress Questionnaire    Feeling of Stress : To some extent  Social Connections: Unknown (08/22/2021)   [facility] to [facility]12/[facility]   Social Connection and Isolation Panel    In a typical week, how many times do you talk on the phone with family, friends, or neighbors?: More than three times a week    How often do you get together with friends or relatives?: Once a week    How often do you attend church or religious services?: Never    Do you belong to any clubs or organizations such as church groups, unions, fraternal or athletic groups, or school groups?: No    How often do you attend meetings of the clubs or organizations you belong to?: Never    Marital Status: Not on file     FAMILY HISTORY:  We obtained a detailed, 3-generation family history.  Significant diagnoses are listed below: Family History  Problem Relation Age of Onset   Alzheimer's disease Mother 29 - 51   Heart attack Father    Alcoholism Father    Heart disease Father    Stomach cancer  Maternal Grandmother 42 - 69   Mental illness Maternal Aunt    Heart attack Other    Heart attack Paternal Aunt    Coronary artery disease Paternal Aunt        open heart surgery   Heart attack Paternal Aunt    Cancer Paternal Uncle        jaw cancer, smoking history, worked in [employer] attack Paternal Uncle 30 - 39   Heart attack Paternal Aunt    Heart disease Paternal Cousin    Heart disease Paternal Cousin    Heart disease Paternal  Cousin    Esophageal cancer Neg Hx    Ms. Zenk is unaware of previous family history of genetic testing. There is no reported Ashkenazi Jewish ancestry.   Ms. Brahmbhatt has a daughter, age 45, who reports normal cholesterol. She has three grandchildren, ages 75, 59 and 6 months. Her mother passed away at age 68 and was diagnosed with Alzheimers disease in her 72s. She reports a maternal aunt with mental illness, deceased. She reports a maternal half-aunt diagnosed with a heart attack that passed away around age 66. She reports her maternal grandmother was diagnosed with stomach cancer in her 61s and passed away in her 60s. Ms. Muhlenkamp reports her father was injured in an accident and was paralyzed. He had a history of heart disease and passed away at 55. She reports a paternal uncle that died of a heart attack in his 30s. She reports a paternal uncle diagnosed with jaw cancer, history of cigarette use and [employer] service, and passed away in his 44s. She reports a paternal aunt with a history of heart attack that died in her 60s or 74s, who had a son die from heart disease in his 54s and another son diagnosed with heart disease. Ms. Milius reports two additional paternal aunts reported to have died of a heart attack in their 59s-60s. She reports a paternal aunt diagnosed with coronary artery disease, treated with open heart surgery, that died in her 83s, who had a son that died in his 30s from heart disease. She reports her paternal grandfather died in a car  accident and that her paternal grandmother died at a young age of uremic poisoning.      GENETIC TEST RESULTS:  Ms. Liz participated in the [place] GeneConnect population genomic screening program. 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 There were no pathogenic or likely pathogenic variants detected in other genes reported on in this study.   [facility] Population Screen is a screening test that analyzes 11 genes related to hereditary breast and ovarian cancer syndrome (HBOC), Lynch syndrome, and familial hypercholesterolemia. These include APOB, BRCA1, BRCA2, EPCAM, LDLR, LDLRAP1, PCSK9, PMS2 (excluding exons 11-15), MLH1, MSH2, MSH6. This test reports pathogenic and likely pathogenic variants, but does not report on variants of unknown significance. The absence of any additional pathogenic variants is reassuring, but does not rule out a hereditary condition. There are other variants and genes associated with heart disease, hereditary cancer and other inherited conditions that were not included in this test. Please see full test report in labs, labeled GeneConnect Molecular Screen, dated 10/05/23.     CLINICAL INFORMATION:  Pathogenic variants in LDLR, such as a deletion of exons 17-18, are associated with an inherited condition called familial hypercholesterolemia (FH).   The LDLR gene provides an instruction for our body to make a protein called low-density lipoproteins (LDLs), which play an important role in regulating the amount of cholesterol in the blood. Pathogenic variants or mutations in LDLR that impact gene function can cause a form of high cholesterol called familial hypercholesterolemia. However, not all individuals with LDLR pathogenic variants will have signs of familial hypercholesterolemia.   Familial hypercholesterolemia (FH) is a genetic condition characterized by abnormally high concentrations of low  density lipoprotein cholesterol (LDL-C) in the blood since birth. Build up of cholesterol in the body can lead to plaques, which can lead to arteries that bring blood to the heart to become narrowed or blocked. FH  increase the risk for premature coronary heart disease (CHD), stroke, and death. In addition to extreme hypercholesterolemia, affected individuals may present with vascular-related manifestations of CHD, such as angina, myocardial infarction, peripheral vascular disease, and stroke. Physical manifestations, including xanthomas (subcutaneous fat deposits), xanthelasmas (subcutaneous fat deposits around the eyes), and corneal arcus (whitish ring around the iris), may also be observed.   Total cholesterol concentrations in heterozygous FH patients (genetic defect inherited from one parent) are typically in the range of 300 to 550 mg/dL (but sometimes lower) and in homozygotes (genetic defects inherited from both parents) range from 650 to 1000 mg/dL.  Individuals with FH have a significantly increased risk for CHD, estimated to be 20 times greater than that of the general population. If untreated, men with FH have a 50% risk and women with FH have a 30% risk of having a cardiovascular event by ages 63 and 60 years, respectively. The prevalence of FH is estimated to be 1:300 to 1:500 individuals in the general population, with an even higher prevalence in certain ethnic groups.   Heterozygous FH (HeFH) is inherited in an autosomal dominant manner. Almost all affected individuals inherit the familial mutation from an affected parent. In addition, all children of an individual with HeFH have a 50% chance of inheriting the mutation. Homozygous FH (HoFH) is rare (prevalence 1:1,000,000) and occurs when an individual inherits two deleterious mutations. Thus, if both parents have HeFH, each of their children would have a 25% risk of inheriting HoFH and a 50% risk of inheriting HeFH.  A diagnosis of FH is  typically made on a clinical basis. There are several sets of validated diagnostic criteria available, including the [place]  MEDPED Program, the [place] FH Registry, and the [employer]. These criteria take into account a combination of factors, including elevated, untreated LDL-C levels (cut points vary with age), clinical history of premature CHD, physical findings, and family history of elevated LDL-C levels and/or premature CHD, as well as genetic test results. Genetic testing is clinically available and may not be needed for patient identification or management, though this information may be helpful in certain situations, specifically in identifying at risk relatives.   Management guidelines for individuals with FH are available through multiple professional organizations and medical societies, including the [employer] Lipid Association Expert Panel on Familial Hypercholesterolemia. Almost all individuals with FH will need lifelong treatment to lower LDL cholesterol levels, ideally by at least 50%. Treatment typically involves intensive pharmacologic therapy, with statins as the first drug of choice, often in combination with other lipid-lowering drugs.  In addition, lifestyle modifications to address cardiovascular risk factors are recommended for all FH patients. Individuals with FH may also benefit from referral to a lipid specialist, particularly if treatment goals are not being met with maximal medical therapy.    IMPLICATIONS FOR FAMILY MEMBERS: Individuals with FH are encouraged to share information about their diagnosis with at-risk family members. A family letter will be provided to help share this result with relatives. "Cascade screening" is a systematic process through which additional family members with FH are identified, starting with all first degree relatives (parents, siblings, and children) of an FH patient. Lipid or molecular analysis can be used to screen family  members. Since all first degree relatives of affected individuals have a 50% risk of also having FH, cascade screening is a high-yield and cost-effective way of identifying previously undiagnosed FH patients.   Evaluation of relatives for familial hypercholesterolemia should include minors (individuals  under the age of 57). The recommendation from the National Lipid Association's Expert Panel on FH is that cholesterol screening should be considered beginning at age 87 for children with a family history of premature CHD or elevated cholesterol.  If family members have FH they require immediate initiation of treatment and management of other CHD risk factors (e.g. hypertension, diabetes, smoking) since individuals with FH have a very high lifetime risk of CHD and are at high risk of premature CHD.   ADDITIONAL TESTING: We did not discuss additional genetic testing at this time.   RESOURCES: Family Heart Foundation: [employer].co.uk  National Lipid Association: https://www.learnyourlipids.com/  PLAN:  Share genetic test result with lipidology provider.   Results will be shared with Ms. Granieri's primary care provider, Claudene Elsie DASEN, MD, for further management.   Genetic test results should be shared with relatives, who can consider undergoing genetic testing for the LDLR  pathogenic variant and/or monitoring of cholesterol.   Lastly, we encouraged Ms. Weintraub to remain in contact with [facility]'s genetics team so that we can continuously update the family history and inform her of any changes in our understanding of familial hypercholesterolemia and any additional genetic testing that may be of benefit for this family.   Ms. Pickart questions were answered to her satisfaction today. Our contact information was provided should additional questions or concerns arise. Thank you for the referral and allowing us  to share in the care of your patient.   Burnard Ogren,  MS, [facility] Licensed, Retail [email or web address] phone: [phone]  44 minutes were spent on the date of the encounter in service to the patient including preparation, face-to-face consultation, documentation and care coordination.  The patient was seen alone.     I connected with  Ms. Fairley on 11/06/23 at 2:02 pm EDT by MyChart video conference and verified that I am speaking with the correct person using two identifiers.   Patient location: home Provider location: [facility]  __________________________________________________________________ For Office Staff:  Number of people involved in session: 1 Was an Intern/ student involved with case: no

## 2024-01-09 ENCOUNTER — Encounter: Payer: Self-pay | Admitting: Gastroenterology

## 2024-02-02 ENCOUNTER — Encounter: Payer: Self-pay | Admitting: Gastroenterology

## 2024-02-25 ENCOUNTER — Telehealth: Payer: Self-pay

## 2024-02-25 NOTE — Telephone Encounter (Signed)
 Telephone call placed to patient, VM obtained and message left to call RN back. When RN was chart prepping, RN noted patient had an upcoming appt for Endocolon.  Per Dr. Ira notes from last Endocolon in 2022 - she was to repeat colonoscopy in 7 years and EGD in 3 years.  RN needs to know if patient is experiencing any GI sypmptoms and then will send msg to Dr. Charlanne to see if he wants to repeat colon now as well.  (Will also need to ask Dr. Charlanne if he wants to obtain cardiac clearance) SChaplin, RN,BSN

## 2024-02-25 NOTE — Telephone Encounter (Signed)
 Patient returned call:  PV 2/2; Endocolon 2/16  Dr. Charlanne, please see my note below.  Patient states she did not think she was due for her colonoscopy yet, only upper endoscopy d/t Barrett's.  I asked her if she is having any GI s/s, she said I am in an Integrated Care Program and see a coach Q wk.  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   Please advise if you want her to proceed with endocolon or just EGD. Also, d/t her cardiac hx (cath in 08/2023), please advise if you want cardiac clearance, OV etc and if Miralax prep is ok d/t CKD stage III. Thank you, Chaelyn Bunyan

## 2024-02-28 NOTE — Telephone Encounter (Signed)
 Lets just do EGD for now. RG

## 2024-03-01 NOTE — Telephone Encounter (Signed)
 Patient called and notified that Dr. Charlanne only wants to do the EGD for now.  She verbalized understanding. Endocolon appt changed to EGD only 03/28/24 @ 0930; PV remains 2/2@ 10:30.

## 2024-03-14 ENCOUNTER — Ambulatory Visit

## 2024-03-14 VITALS — Ht 64.0 in | Wt 150.0 lb

## 2024-03-14 DIAGNOSIS — K2270 Barrett's esophagus without dysplasia: Secondary | ICD-10-CM

## 2024-03-14 NOTE — Progress Notes (Signed)
 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 and noted in allergy section on chart No soy allergy known to patient  No issues known to pt with past sedation with any surgeries or procedures Patient denies ever being told they had issues or difficulty with intubation  No FH of Malignant Hyperthermia Pt is not on diet pills Pt is not on  home 02  Pt is not on blood thinners  Pt denies issues with constipation  No A fib or A flutter Have any cardiac testing pending--No Pt can ambulate  Pt denies use of chewing tobacco Discussed diabetic I weight loss medication holds Discussed NSAID holds Checked BMI Pt instructed to use [employer] or GoodRx for a price reduction on prep  Patient's chart reviewed by Norleen Schillings CNRA prior to previsit and patient appropriate for the [facility].  Pre visit completed and red dot placed by patient's name on their procedure day (on provider's schedule).

## 2024-03-28 ENCOUNTER — Encounter: Admitting: Gastroenterology
